# Patient Record
Sex: Female | Born: 1945 | Race: White | Hispanic: No | Marital: Married | Smoking: Never smoker
Health system: Southern US, Community
[De-identification: ages and names within clinical notes are randomized; demographics above are authoritative.]

## PROBLEM LIST (undated history)

## (undated) DIAGNOSIS — C801 Malignant (primary) neoplasm, unspecified: Secondary | ICD-10-CM

## (undated) DIAGNOSIS — C649 Malignant neoplasm of unspecified kidney, except renal pelvis: Secondary | ICD-10-CM

## (undated) DIAGNOSIS — E119 Type 2 diabetes mellitus without complications: Secondary | ICD-10-CM

## (undated) HISTORY — PX: PANCREAS SURGERY: SHX731

## (undated) HISTORY — PX: ENDOMETRIAL ABLATION: SHX621

## (undated) HISTORY — PX: ABDOMINAL HYSTERECTOMY: SHX81

## (undated) HISTORY — PX: NASAL SINUS SURGERY: SHX719

## (undated) HISTORY — PX: KIDNEY SURGERY: SHX687

## (undated) HISTORY — PX: CHOLECYSTECTOMY: SHX55

---

## 2010-04-13 ENCOUNTER — Emergency Department (HOSPITAL_BASED_OUTPATIENT_CLINIC_OR_DEPARTMENT_OTHER): Admission: EM | Admit: 2010-04-13 | Discharge: 2010-04-13 | Payer: Self-pay | Admitting: Emergency Medicine

## 2010-11-02 LAB — URINALYSIS, ROUTINE W REFLEX MICROSCOPIC
Glucose, UA: NEGATIVE mg/dL
Ketones, ur: NEGATIVE mg/dL
Nitrite: NEGATIVE
Specific Gravity, Urine: 1.011 (ref 1.005–1.030)
pH: 6 (ref 5.0–8.0)

## 2010-11-02 LAB — CBC
HCT: 44.1 % (ref 36.0–46.0)
MCV: 88.8 fL (ref 78.0–100.0)
Platelets: 183 10*3/uL (ref 150–400)
RBC: 4.97 MIL/uL (ref 3.87–5.11)
WBC: 12.4 10*3/uL — ABNORMAL HIGH (ref 4.0–10.5)

## 2010-11-02 LAB — BASIC METABOLIC PANEL
Chloride: 107 mEq/L (ref 96–112)
Creatinine, Ser: 1.2 mg/dL (ref 0.4–1.2)
GFR calc Af Amer: 55 mL/min — ABNORMAL LOW (ref >60)
GFR calc non Af Amer: 45 mL/min — ABNORMAL LOW (ref >60)
Potassium: 3.9 mEq/L (ref 3.5–5.1)

## 2010-11-02 LAB — DIFFERENTIAL
Basophils Absolute: 0.1 10*3/uL (ref 0.0–0.1)
Eosinophils Relative: 1 % (ref 0–5)
Lymphocytes Relative: 9 % — ABNORMAL LOW (ref 12–46)
Lymphs Abs: 1.2 10*3/uL (ref 0.7–4.0)
Monocytes Absolute: 1.1 10*3/uL — ABNORMAL HIGH (ref 0.1–1.0)
Neutro Abs: 9.9 10*3/uL — ABNORMAL HIGH (ref 1.7–7.7)

## 2010-11-02 LAB — URINE MICROSCOPIC-ADD ON

## 2016-11-22 ENCOUNTER — Encounter (HOSPITAL_BASED_OUTPATIENT_CLINIC_OR_DEPARTMENT_OTHER): Payer: Self-pay | Admitting: *Deleted

## 2016-11-22 ENCOUNTER — Emergency Department (HOSPITAL_BASED_OUTPATIENT_CLINIC_OR_DEPARTMENT_OTHER): Payer: Medicare Other

## 2016-11-22 ENCOUNTER — Emergency Department (HOSPITAL_BASED_OUTPATIENT_CLINIC_OR_DEPARTMENT_OTHER)
Admission: EM | Admit: 2016-11-22 | Discharge: 2016-11-22 | Disposition: A | Discharge status: Other Acute Inpatient Hospital | Payer: Medicare Other | Attending: Emergency Medicine | Admitting: Emergency Medicine

## 2016-11-22 DIAGNOSIS — N3000 Acute cystitis without hematuria: Secondary | ICD-10-CM | POA: Insufficient documentation

## 2016-11-22 DIAGNOSIS — J111 Influenza due to unidentified influenza virus with other respiratory manifestations: Secondary | ICD-10-CM

## 2016-11-22 DIAGNOSIS — R69 Illness, unspecified: Secondary | ICD-10-CM

## 2016-11-22 DIAGNOSIS — Z85528 Personal history of other malignant neoplasm of kidney: Secondary | ICD-10-CM | POA: Diagnosis not present

## 2016-11-22 DIAGNOSIS — R05 Cough: Secondary | ICD-10-CM | POA: Diagnosis present

## 2016-11-22 HISTORY — DX: Cystic fibrosis, unspecified: E84.9

## 2016-11-22 HISTORY — DX: Malignant neoplasm of unspecified kidney, except renal pelvis: C64.9

## 2016-11-22 HISTORY — DX: Malignant (primary) neoplasm, unspecified: C80.1

## 2016-11-22 LAB — CBC WITH DIFFERENTIAL/PLATELET
BASOS ABS: 0 10*3/uL (ref 0.0–0.1)
BASOS PCT: 0 %
EOS ABS: 0.1 10*3/uL (ref 0.0–0.7)
Eosinophils Relative: 2 %
HEMATOCRIT: 41.4 % (ref 36.0–46.0)
HEMOGLOBIN: 14.4 g/dL (ref 12.0–15.0)
Lymphocytes Relative: 16 %
Lymphs Abs: 1.3 10*3/uL (ref 0.7–4.0)
MCH: 32.1 pg (ref 26.0–34.0)
MCHC: 34.8 g/dL (ref 30.0–36.0)
MCV: 92.4 fL (ref 78.0–100.0)
MONOS PCT: 10 %
Monocytes Absolute: 0.8 10*3/uL (ref 0.1–1.0)
NEUTROS ABS: 5.8 10*3/uL (ref 1.7–7.7)
NEUTROS PCT: 72 %
Platelets: 192 10*3/uL (ref 150–400)
RBC: 4.48 MIL/uL (ref 3.87–5.11)
RDW: 18.7 % — ABNORMAL HIGH (ref 11.5–15.5)
WBC: 8 10*3/uL (ref 4.0–10.5)

## 2016-11-22 LAB — COMPREHENSIVE METABOLIC PANEL
ALK PHOS: 70 U/L (ref 38–126)
ALT: 40 U/L (ref 14–54)
ANION GAP: 9 (ref 5–15)
AST: 31 U/L (ref 15–41)
Albumin: 3.5 g/dL (ref 3.5–5.0)
BILIRUBIN TOTAL: 1.2 mg/dL (ref 0.3–1.2)
BUN: 16 mg/dL (ref 6–20)
CALCIUM: 8.9 mg/dL (ref 8.9–10.3)
CO2: 23 mmol/L (ref 22–32)
CREATININE: 1.09 mg/dL — AB (ref 0.44–1.00)
Chloride: 103 mmol/L (ref 101–111)
GFR, EST AFRICAN AMERICAN: 58 mL/min — AB (ref >60)
GFR, EST NON AFRICAN AMERICAN: 50 mL/min — AB (ref >60)
Glucose, Bld: 108 mg/dL — ABNORMAL HIGH (ref 65–99)
Potassium: 3.9 mmol/L (ref 3.5–5.1)
SODIUM: 135 mmol/L (ref 135–145)
TOTAL PROTEIN: 7.3 g/dL (ref 6.5–8.1)

## 2016-11-22 LAB — URINALYSIS, ROUTINE W REFLEX MICROSCOPIC
Glucose, UA: NEGATIVE mg/dL
HGB URINE DIPSTICK: NEGATIVE
Ketones, ur: 15 mg/dL — AB
NITRITE: NEGATIVE
PH: 6 (ref 5.0–8.0)
Protein, ur: >300 mg/dL — AB
SPECIFIC GRAVITY, URINE: 1.028 (ref 1.005–1.030)

## 2016-11-22 LAB — URINALYSIS, MICROSCOPIC (REFLEX)

## 2016-11-22 LAB — I-STAT CG4 LACTIC ACID, ED: Lactic Acid, Venous: 1.44 mmol/L (ref 0.5–1.9)

## 2016-11-22 LAB — INFLUENZA PANEL BY PCR (TYPE A & B)
INFLAPCR: NEGATIVE
INFLBPCR: NEGATIVE

## 2016-11-22 MED ORDER — SODIUM CHLORIDE 0.9 % IV BOLUS (SEPSIS)
1000.0000 mL | Freq: Once | INTRAVENOUS | Status: AC
  Administered 2016-11-22: 1000 mL via INTRAVENOUS

## 2016-11-22 MED ORDER — DEXTROSE 5 % IV SOLN
1.0000 g | Freq: Once | INTRAVENOUS | Status: AC
  Administered 2016-11-22: 1 g via INTRAVENOUS
  Filled 2016-11-22: qty 10

## 2016-11-22 MED ORDER — PROMETHAZINE HCL 25 MG/ML IJ SOLN
12.5000 mg | Freq: Once | INTRAMUSCULAR | Status: AC
  Administered 2016-11-22: 12.5 mg via INTRAVENOUS
  Filled 2016-11-22: qty 1

## 2016-11-22 MED ORDER — OSELTAMIVIR PHOSPHATE 75 MG PO CAPS
75.0000 mg | ORAL_CAPSULE | Freq: Once | ORAL | Status: AC
  Administered 2016-11-22: 75 mg via ORAL
  Filled 2016-11-22: qty 1

## 2016-11-22 MED ORDER — ACETAMINOPHEN 325 MG PO TABS
650.0000 mg | ORAL_TABLET | Freq: Once | ORAL | Status: AC
  Administered 2016-11-22: 650 mg via ORAL
  Filled 2016-11-22: qty 2

## 2016-11-22 MED ORDER — MORPHINE SULFATE (PF) 4 MG/ML IV SOLN
4.0000 mg | Freq: Once | INTRAVENOUS | Status: AC
  Administered 2016-11-22: 4 mg via INTRAVENOUS
  Filled 2016-11-22: qty 1

## 2016-11-22 NOTE — ED Provider Notes (Signed)
Emergency Department Provider Note   I have reviewed the triage vital signs and the nursing notes.   HISTORY  Chief Complaint Cough   HPI Taylor Stein is a 71 y.o. 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 He has baseline right-sided abdominal pain which is unchanged. She is also reporting sores in the mouth which she relates to her ongoing chemotherapy. She's had fever chills at home for the past several days. She presented to her primary care physician's office with the above symptoms and was referred to the emergency department for further testing. She denies any CP. No modifying factors. No dysuria, hesitancy, or urgency.   Past Medical History:  Diagnosis Date  . Cancer ([place])   . 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 Renal cell carcinoma ([place])     There are no active problems to display for this patient.   Past Surgical History:  Procedure Laterality Date  . ABDOMINAL HYSTERECTOMY    . CHOLECYSTECTOMY    . ENDOMETRIAL ABLATION    . KIDNEY SURGERY    . NASAL SINUS SURGERY      Current Outpatient Rx  . Order #: [id number] Class: Historical Med    Allergies Sulfa antibiotics; Codeine; Dilaudid [hydromorphone hcl]; Percocet [oxycodone-acetaminophen]; and Vicodin [hydrocodone-acetaminophen]  No family history on file.  Social History Social History  Substance Use Topics  . Smoking status: Never Smoker  . Smokeless tobacco: Never Used  . Alcohol use No    Review of Systems  Constitutional: Positive fever/chills. Positive fatigue.  Eyes: No visual changes. ENT: No sore throat. Positive nasal congestion.  Cardiovascular: Denies chest pain. Respiratory: Positive shortness of breath and cough.  Gastrointestinal: Positive RUQ abdominal pain. Positive severe nausea, no vomiting.  No diarrhea.  No constipation. Genitourinary: Negative for  dysuria. Musculoskeletal: Negative for back pain. Skin: Negative for rash. Neurological: Negative for focal weakness or numbness. Positive mild HA.   10-point ROS otherwise negative.  ____________________________________________   PHYSICAL EXAM:  VITAL SIGNS: ED Triage Vitals  Enc Vitals Group     BP 11/22/16 1719 129/81     Pulse Rate 11/22/16 1719 (!) 110     Resp 11/22/16 1719 20     Temp 11/22/16 1719 (!) 100.4 F (38 C)     Temp Source 11/22/16 1719 Oral     SpO2 11/22/16 1719 95 %     Weight 11/22/16 1719 164 lb (74.4 kg)     Height 11/22/16 1719 5\' 4"  (1.626 m)     Pain Score 11/22/16 1724 10   Constitutional: Alert and oriented. Appears generally weak and fatigued.  Eyes: Conjunctivae are normal. PERRL.  Head: Atraumatic. Nose: No congestion/rhinnorhea. Mouth/Throat: Mucous membranes are dry. Oropharynx non-erythematous. Neck: No stridor.  No meningeal signs.  Cardiovascular: Normal rate, regular rhythm. Good peripheral circulation. Grossly normal heart sounds.   Respiratory: Normal respiratory effort.  No retractions. Lungs CTAB. Gastrointestinal: Soft with focal RUQ tenderness to palpation. No rebound or guarding. No distention.  Musculoskeletal: No lower extremity tenderness nor edema. No gross deformities of extremities. Neurologic:  Normal speech and language. No gross focal neurologic deficits are appreciated.  Skin:  Skin is warm, dry and intact. No rash noted. Psychiatric: Mood and affect are normal. Speech and behavior are normal.  ____________________________________________   LABS (all labs ordered are listed, but only abnormal results are displayed)  Labs Reviewed  COMPREHENSIVE METABOLIC PANEL -  Abnormal; Notable for the following:       Result Value   Glucose, Bld 108 (*)    Creatinine, Ser 1.09 (*)    GFR calc non Af Amer 50 (*)    GFR calc Af Amer 58 (*)    All other components within normal limits  CBC WITH DIFFERENTIAL/PLATELET -  Abnormal; Notable for the following:    RDW 18.7 (*)    All other components within normal limits  URINALYSIS, ROUTINE W REFLEX MICROSCOPIC - Abnormal; Notable for the following:    Color, Urine AMBER (*)    APPearance CLOUDY (*)    Bilirubin Urine MODERATE (*)    Ketones, ur 15 (*)    Protein, ur >300 (*)    Leukocytes, UA MODERATE (*)    All other components within normal limits  URINALYSIS, MICROSCOPIC (REFLEX) - Abnormal; Notable for the following:    Bacteria, UA FEW (*)    Squamous Epithelial / LPF 6-30 (*)    All other components within normal limits  CULTURE, BLOOD (ROUTINE X 2)  CULTURE, BLOOD (ROUTINE X 2)  INFLUENZA PANEL BY PCR (TYPE A & B)  I-STAT CG4 LACTIC ACID, ED   ____________________________________________  EKG   EKG Interpretation  Date/Time:  Friday November 22 2016 18:59:30 EDT Ventricular Rate:  99 PR Interval:    QRS Duration: 87 QT Interval:  405 QTC Calculation: 520 R Axis:   57 Text Interpretation:  Sinus tachycardia Low voltage, precordial leads Nonspecific T abnormalities, lateral leads Prolonged QT interval No STEMI.  Confirmed by Shaneka Efaw MD, Ripley Lovecchio [id number]) on 11/22/2016 7:02:27 PM       ____________________________________________  RADIOLOGY  Dg Chest 2 View  Result Date: 11/22/2016 CLINICAL DATA:  Cough and fever for 1 week. History of renal cell carcinoma and cystic fibrosis. EXAM: CHEST  2 VIEW COMPARISON:  None. FINDINGS: Heart size and mediastinal contours are normal. Right chest wall Port-A-Cath appears appropriately positioned with tip at the level of the lower SVC. Lungs are at least mildly hyperexpanded. Lungs are clear. No pleural effusion or pneumothorax seen. Mild degenerative spurring throughout the slightly kyphotic thoracic spine. No acute or suspicious osseous finding. IMPRESSION: No active cardiopulmonary disease. No evidence of pneumonia or pulmonary edema. Electronically Signed   By: Franki Cabot M.D.   On: 11/22/2016 18:01     ____________________________________________   PROCEDURES  Procedure(s) performed:   Procedures  None ____________________________________________   INITIAL IMPRESSION / ASSESSMENT AND PLAN / ED COURSE  Pertinent labs & imaging results that were available during my care of the patient were reviewed by me and considered in my medical decision making (see chart for details).  Patient resents to the emergency department for evaluation of flulike symptoms along with fever in the setting of ongoing chemotherapy for metastatic renal cell carcinoma. The patient has a fever on arrival with associated tachycardia and mild hypoxemia. My suspicion for influenza is somewhat elevated. Also concern for possible underlying pneumonia or developing sepsis. Patient has a well appearing port in the right upper chest. She has tenderness in the right upper quadrant of her abdomen which she describes as baseline since her cancer returned.  Discussed patient's case with [facility], Dr. Dwyane Dee. Patient and family (if present) updated with plan. Care transferred to hospitalist service.  I reviewed all nursing notes, vitals, pertinent old records, EKGs, labs, imaging (as available).  ____________________________________________  FINAL CLINICAL IMPRESSION(S) / ED DIAGNOSES  Final diagnoses:  Influenza-like illness  Acute cystitis  without hematuria     MEDICATIONS GIVEN DURING THIS VISIT:  Medications  promethazine (PHENERGAN) injection 12.5 mg (12.5 mg Intravenous Given 11/22/16 1828)  sodium chloride 0.9 % bolus 1,000 mL (0 mLs Intravenous Stopped 11/22/16 1944)  acetaminophen (TYLENOL) tablet 650 mg (650 mg Oral Given 11/22/16 1832)  morphine 4 MG/ML injection 4 mg (4 mg Intravenous Given 11/22/16 1829)  sodium chloride 0.9 % bolus 1,000 mL (0 mLs Intravenous Stopped 11/22/16 2123)  morphine 4 MG/ML injection 4 mg (4 mg Intravenous Given 11/22/16 2047)  promethazine (PHENERGAN)  injection 12.5 mg (12.5 mg Intravenous Given 11/22/16 2048)  oseltamivir (TAMIFLU) capsule 75 mg (75 mg Oral Given 11/22/16 2123)  cefTRIAXone (ROCEPHIN) 1 g in dextrose 5 % 50 mL IVPB (0 g Intravenous Stopped 11/22/16 2237)     NEW OUTPATIENT MEDICATIONS STARTED DURING THIS VISIT:  None   Note:  This document was prepared using Dragon voice recognition software and may include unintentional dictation errors.  Nanda Quinton, MD Emergency Medicine   Margette Fast, MD 11/24/16 [phone]

## 2016-11-22 NOTE — ED Triage Notes (Signed)
Sent from her doctors office with fever, cough, ear pain, sore throat, body aches. Possible dehydration.

## 2016-11-27 LAB — CULTURE, BLOOD (ROUTINE X 2)
CULTURE: NO GROWTH
Culture: NO GROWTH
SPECIAL REQUESTS: ADEQUATE
SPECIAL REQUESTS: ADEQUATE

## 2017-11-28 ENCOUNTER — Emergency Department (HOSPITAL_BASED_OUTPATIENT_CLINIC_OR_DEPARTMENT_OTHER): Payer: Medicare Other

## 2017-11-28 ENCOUNTER — Emergency Department (HOSPITAL_BASED_OUTPATIENT_CLINIC_OR_DEPARTMENT_OTHER)
Admission: EM | Admit: 2017-11-28 | Discharge: 2017-11-28 | Disposition: A | Discharge status: Home / Self Care | Payer: Medicare Other | Attending: Emergency Medicine | Admitting: Emergency Medicine

## 2017-11-28 ENCOUNTER — Encounter (HOSPITAL_BASED_OUTPATIENT_CLINIC_OR_DEPARTMENT_OTHER): Payer: Self-pay

## 2017-11-28 ENCOUNTER — Other Ambulatory Visit: Payer: Self-pay

## 2017-11-28 DIAGNOSIS — J069 Acute upper respiratory infection, unspecified: Secondary | ICD-10-CM

## 2017-11-28 DIAGNOSIS — Z85528 Personal history of other malignant neoplasm of kidney: Secondary | ICD-10-CM | POA: Diagnosis not present

## 2017-11-28 DIAGNOSIS — R05 Cough: Secondary | ICD-10-CM | POA: Diagnosis present

## 2017-11-28 LAB — CBC WITH DIFFERENTIAL/PLATELET
BASOS ABS: 0 10*3/uL (ref 0.0–0.1)
Basophils Relative: 0 %
EOS ABS: 0.2 10*3/uL (ref 0.0–0.7)
Eosinophils Relative: 3 %
HCT: 40.8 % (ref 36.0–46.0)
HEMOGLOBIN: 12.8 g/dL (ref 12.0–15.0)
LYMPHS ABS: 1.4 10*3/uL (ref 0.7–4.0)
Lymphocytes Relative: 21 %
MCH: 28.4 pg (ref 26.0–34.0)
MCHC: 31.4 g/dL (ref 30.0–36.0)
MCV: 90.5 fL (ref 78.0–100.0)
Monocytes Absolute: 0.9 10*3/uL (ref 0.1–1.0)
Monocytes Relative: 14 %
NEUTROS PCT: 62 %
Neutro Abs: 4.1 10*3/uL (ref 1.7–7.7)
PLATELETS: 165 10*3/uL (ref 150–400)
RBC: 4.51 MIL/uL (ref 3.87–5.11)
RDW: 14.6 % (ref 11.5–15.5)
WBC: 6.6 10*3/uL (ref 4.0–10.5)

## 2017-11-28 LAB — COMPREHENSIVE METABOLIC PANEL
ALBUMIN: 3.9 g/dL (ref 3.5–5.0)
ALK PHOS: 71 U/L (ref 38–126)
ALT: 18 U/L (ref 14–54)
AST: 19 U/L (ref 15–41)
Anion gap: 9 (ref 5–15)
BUN: 14 mg/dL (ref 6–20)
CALCIUM: 9.3 mg/dL (ref 8.9–10.3)
CHLORIDE: 106 mmol/L (ref 101–111)
CO2: 22 mmol/L (ref 22–32)
CREATININE: 1.25 mg/dL — AB (ref 0.44–1.00)
GFR calc non Af Amer: 42 mL/min — ABNORMAL LOW (ref >60)
GFR, EST AFRICAN AMERICAN: 49 mL/min — AB (ref >60)
GLUCOSE: 113 mg/dL — AB (ref 65–99)
Potassium: 3.9 mmol/L (ref 3.5–5.1)
SODIUM: 137 mmol/L (ref 135–145)
Total Bilirubin: 0.6 mg/dL (ref 0.3–1.2)
Total Protein: 7.3 g/dL (ref 6.5–8.1)

## 2017-11-28 LAB — I-STAT CG4 LACTIC ACID, ED: Lactic Acid, Venous: 1.18 mmol/L (ref 0.5–1.9)

## 2017-11-28 MED ORDER — IPRATROPIUM-ALBUTEROL 0.5-2.5 (3) MG/3ML IN SOLN
3.0000 mL | Freq: Four times a day (QID) | RESPIRATORY_TRACT | Status: DC
  Administered 2017-11-28: 3 mL via RESPIRATORY_TRACT
  Filled 2017-11-28: qty 3

## 2017-11-28 MED ORDER — AZITHROMYCIN 250 MG PO TABS: ORAL_TABLET | ORAL | 0 refills | Status: DC

## 2017-11-28 NOTE — ED Notes (Signed)
Patient transported to X-ray 

## 2017-11-28 NOTE — Discharge Instructions (Signed)
Your flu [place] pending  and you can check your my chart, however you are out of the window for treatment at this time.  You appear to have an upper respiratory infection (URI). An upper respiratory tract infection, or cold, is a viral infection of the air passages leading to the lungs. It is contagious and can be spread to others, especially during the first 3 or 4 days.  RETURN IMMEDIATELY IF you develop shortness of breath, confusion or altered mental status, a new rash, become dizzy, faint, or poorly responsive, or are unable to be cared for at home.

## 2017-11-28 NOTE — ED Triage Notes (Signed)
C.o flu like sx day 3-NAD-to triage in w/c

## 2017-11-28 NOTE — ED Provider Notes (Addendum)
[facility]ER[facility]ENCY DEPARTMENT Provider Note   CSN: [id number] Arrival date & time: 11/28/17  1519     History   Chief Complaint Chief Complaint  Patient presents with  . Cough    HPI Taylor Stein is a 72 y.o. female who presents to the ED with CC of Cough. Onset 3 days ago. She has had associated fever of 101.1, relieved by antipyretics. She has associated painful and productive cough, Myalgia, rhinorrhea, denies sore throat. She had a recent surgery on her pancreas for pancreatic cancer and states taht she is unsure if the coughing pain is because her surgical site is still sore. SHe has a portacath in place and recently completed chemotherapy. 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  Her husband has had a recent URI. Patient took tylenol at 2:00 PM today.   HPI  Past Medical History:  Diagnosis Date  . Cancer ([place])   . 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 Renal cell carcinoma ([place])   . Renal cell carcinoma ([place])     There are no active problems to display for this patient.   Past Surgical History:  Procedure Laterality Date  . ABDOMINAL HYSTERECTOMY    . CHOLECYSTECTOMY    . ENDOMETRIAL ABLATION    . KIDNEY SURGERY    . NASAL SINUS SURGERY    . PANCREAS SURGERY       OB History   None      Home Medications    Prior to Admission medications   Medication Sig Start Date End Date Taking? Authorizing Provider  ALBUTEROL IN Inhale into the lungs.    [provider]    Family History No family history on file.  Social History Social History   Tobacco Use  . Smoking status: Never Smoker  . Smokeless tobacco: Never Used  Substance Use Topics  . Alcohol use: No  . Drug use: Never     Allergies   Sulfa antibiotics; Codeine; Dilaudid [hydromorphone hcl]; Gentamicin; Percocet [oxycodone-acetaminophen]; Vancomycin; and Vicodin [hydrocodone-acetaminophen]   Review of Systems Review of Systems Ten systems reviewed  and are negative for acute change, except as noted in the HPI.    Physical Exam Updated Vital Signs BP 127/81 (BP Location: Left Arm)   Pulse 76   Temp 98.8 F (37.1 C) (Oral)   Resp 20   Ht 5\' 4"  (1.626 m)   Wt 78.9 kg (174 lb)   SpO2 98%   BMI 29.87 kg/m   Physical Exam  Constitutional: She is oriented to person, place, and time. She appears well-developed and well-nourished. No distress.  Appears ill  HENT:  Head: Normocephalic and atraumatic.  Eyes: Pupils are equal, round, and reactive to light. Conjunctivae and EOM are normal. No scleral icterus.  Neck: Normal range of motion.  Cardiovascular: Normal rate, regular rhythm and normal heart sounds. Exam reveals no gallop and no friction rub.  No murmur heard. Pulmonary/Chest: Effort normal and breath sounds normal. No respiratory distress. She has no wheezes.  Abdominal: Soft. Bowel sounds are normal. She exhibits no distension and no mass. There is no tenderness. There is no guarding.  Neurological: She is alert and oriented to person, place, and time.  Skin: Skin is warm and dry. She is not diaphoretic.  Psychiatric: Her behavior is normal.  Nursing note and vitals reviewed.    ED Treatments / Results  Labs (all labs ordered are listed, but only abnormal results are displayed) Labs  Reviewed  CBC WITH DIFFERENTIAL/PLATELET  COMPREHENSIVE METABOLIC PANEL  INFLUENZA PANEL BY PCR (TYPE A & B)  I-STAT CG4 LACTIC ACID, ED    EKG None  Radiology Dg Chest 2 View  Result Date: 11/28/2017 CLINICAL DATA:  Fever, cough, congestion, nausea, and body aches for 2 days. History of cystic fibrosis and renal cell carcinoma. EXAM: CHEST - 2 VIEW COMPARISON:  11/22/2016 FINDINGS: Right jugular Port-A-Cath terminates over the lower SVC, unchanged. The cardiomediastinal silhouette is within normal limits. No airspace consolidation, edema, pleural effusion, or pneumothorax is identified. No acute osseous abnormality is seen.  Retroperitoneal surgical clips are partially visualized in the upper abdomen. IMPRESSION: No active cardiopulmonary disease. Electronically Signed   By: Logan Bores M.D.   On: 11/28/2017 15:59    Procedures Procedures (including critical care time)  Medications Ordered in ED Medications - No data to display   Initial Impression / Assessment and Plan / ED Course  I have reviewed the triage vital signs and the nursing notes.  Pertinent labs & imaging results that were available during my care of the patient were reviewed by me and considered in my medical decision making (see chart for details).     paitent with uri sxs and recent fevers. Her influenza is still pending. Patient has a complicated pmh including CF and gets recurrent CAP. Given this I will treat with azithromycin. Patient is out of the window for Flu treatment. She may follow closely with her pcp. DIscussed return precautions.   Final Clinical Impressions(s) / ED Diagnoses   Final diagnoses:  Upper respiratory tract infection, unspecified type    ED Discharge Orders    None       Margarita Mail, PA-C 11/29/17 0000    Blanchie Dessert, MD 11/29/17 0011    Margarita Mail, PA-C 12/13/17 4403    Blanchie Dessert, MD 12/13/17 2052

## 2017-11-29 LAB — INFLUENZA PANEL BY PCR (TYPE A & B)
Influenza A By PCR: NEGATIVE
Influenza B By PCR: NEGATIVE

## 2019-04-23 ENCOUNTER — Emergency Department (HOSPITAL_BASED_OUTPATIENT_CLINIC_OR_DEPARTMENT_OTHER)
Admission: EM | Admit: 2019-04-23 | Discharge: 2019-04-23 | Disposition: A | Discharge status: Home / Self Care | Payer: Medicare Other | Attending: Emergency Medicine | Admitting: Emergency Medicine

## 2019-04-23 ENCOUNTER — Emergency Department (HOSPITAL_BASED_OUTPATIENT_CLINIC_OR_DEPARTMENT_OTHER): Payer: Medicare Other

## 2019-04-23 ENCOUNTER — Encounter (HOSPITAL_BASED_OUTPATIENT_CLINIC_OR_DEPARTMENT_OTHER): Payer: Self-pay | Admitting: *Deleted

## 2019-04-23 ENCOUNTER — Other Ambulatory Visit: Payer: Self-pay

## 2019-04-23 DIAGNOSIS — R059 Cough, unspecified: Secondary | ICD-10-CM

## 2019-04-23 DIAGNOSIS — Z85528 Personal history of other malignant neoplasm of kidney: Secondary | ICD-10-CM | POA: Diagnosis not present

## 2019-04-23 DIAGNOSIS — R05 Cough: Secondary | ICD-10-CM | POA: Diagnosis present

## 2019-04-23 DIAGNOSIS — Z79899 Other long term (current) drug therapy: Secondary | ICD-10-CM | POA: Insufficient documentation

## 2019-04-23 DIAGNOSIS — M546 Pain in thoracic spine: Secondary | ICD-10-CM

## 2019-04-23 LAB — CBC
HCT: 39.3 % (ref 36.0–46.0)
Hemoglobin: 12.5 g/dL (ref 12.0–15.0)
MCH: 29.3 pg (ref 26.0–34.0)
MCHC: 31.8 g/dL (ref 30.0–36.0)
MCV: 92.3 fL (ref 80.0–100.0)
Platelets: 205 10*3/uL (ref 150–400)
RBC: 4.26 MIL/uL (ref 3.87–5.11)
RDW: 14.6 % (ref 11.5–15.5)
WBC: 7.8 10*3/uL (ref 4.0–10.5)
nRBC: 0 % (ref 0.0–0.2)

## 2019-04-23 LAB — BASIC METABOLIC PANEL
Anion gap: 12 (ref 5–15)
BUN: 20 mg/dL (ref 8–23)
CO2: 23 mmol/L (ref 22–32)
Calcium: 9.8 mg/dL (ref 8.9–10.3)
Chloride: 104 mmol/L (ref 98–111)
Creatinine, Ser: 1.04 mg/dL — ABNORMAL HIGH (ref 0.44–1.00)
GFR calc Af Amer: >60 mL/min (ref >60)
GFR calc non Af Amer: 54 mL/min — ABNORMAL LOW (ref >60)
Glucose, Bld: 131 mg/dL — ABNORMAL HIGH (ref 70–99)
Potassium: 4.1 mmol/L (ref 3.5–5.1)
Sodium: 139 mmol/L (ref 135–145)

## 2019-04-23 MED ORDER — SODIUM CHLORIDE 0.9 % IV SOLN
INTRAVENOUS | Status: DC
Start: 2019-04-23 — End: 2019-04-24
  Administered 2019-04-23: 21:00:00 via INTRAVENOUS

## 2019-04-23 MED ORDER — HEPARIN SOD (PORK) LOCK FLUSH 100 UNIT/ML IV SOLN
500.0000 [IU] | Freq: Once | INTRAVENOUS | Status: AC
  Administered 2019-04-23: 23:00:00 500 [IU]
  Filled 2019-04-23: qty 5

## 2019-04-23 MED ORDER — IOHEXOL 350 MG/ML SOLN
75.0000 mL | Freq: Once | INTRAVENOUS | Status: AC | PRN
  Administered 2019-04-23: 21:00:00 75 mL via INTRAVENOUS

## 2019-04-23 MED ORDER — MORPHINE SULFATE (PF) 2 MG/ML IV SOLN
2.0000 mg | Freq: Once | INTRAVENOUS | Status: AC
  Administered 2019-04-23: 21:00:00 2 mg via INTRAVENOUS
  Filled 2019-04-23: qty 1

## 2019-04-23 MED ORDER — TRAMADOL HCL 50 MG PO TABS: 50.0000 mg | ORAL_TABLET | Freq: Four times a day (QID) | ORAL | 0 refills | Status: AC | PRN

## 2019-04-23 MED ORDER — ONDANSETRON HCL 4 MG/2ML IJ SOLN
4.0000 mg | Freq: Once | INTRAMUSCULAR | Status: AC
  Administered 2019-04-23: 21:00:00 4 mg via INTRAVENOUS
  Filled 2019-04-23: qty 2

## 2019-04-23 NOTE — ED Notes (Signed)
Waiting on results from [facility] per radiology protocol

## 2019-04-23 NOTE — ED Triage Notes (Signed)
Back pain for 2 days and it gets worst today.  Coughing for awhile.

## 2019-04-23 NOTE — ED Provider Notes (Signed)
[place] EMERGENCY DEPARTMENT Provider Note   CSN: [id number] Arrival date & time: 04/23/19  1732     History   Chief Complaint Chief Complaint  Patient presents with   Back Pain   Cough    HPI Taylor Stein is a 73 y.o. female.     Patient presenting with a 2-day history of midthoracic back pain.  Patient's had a persistent cough for a while.  The pain is worse with coughing taking a deep breath and also with some movement no history of fall or injury.  Patient has a history of contained metastatic renal cell carcinoma.  And a history of cystic fibrosis.  Patient states that oftentimes when this is occurred in the past she has had pneumonia that does not show up on chest x-ray.  Patient denies any fevers.     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   Past Surgical History:  Procedure Laterality Date   ABDOMINAL HYSTERECTOMY     CHOLECYSTECTOMY     ENDOMETRIAL ABLATION     KIDNEY SURGERY     NASAL SINUS SURGERY     PANCREAS SURGERY       OB History    Gravida  2   Para  2   Term      Preterm      AB      Living        SAB      TAB      Ectopic      Multiple      Live Births               Home Medications    Prior to Admission medications   Medication Sig Start Date End Date Taking? Authorizing Provider  atorvastatin (LIPITOR) 40 MG tablet Take 40 mg by mouth daily.   Yes [provider]  metFORMIN (GLUCOPHAGE) 500 MG tablet Take by mouth 4 (four) times daily.   Yes [provider]  ALBUTEROL IN Inhale into the lungs.    [provider]    Family History Family History  Problem Relation Age of Onset   Heart failure Mother     Social History Social History   Tobacco Use   Smoking status: Never Smoker    Smokeless tobacco: Never Used  Substance Use Topics   Alcohol use: No   Drug use: Never     Allergies   Sulfa antibiotics, Codeine, Dilaudid [hydromorphone hcl], Gentamicin, Percocet [oxycodone-acetaminophen], Vancomycin, and Vicodin [hydrocodone-acetaminophen]   Review of Systems Review of Systems  Constitutional: Negative for chills and fever.  HENT: Negative for congestion, rhinorrhea and sore throat.   Eyes: Negative for visual disturbance.  Respiratory: Positive for cough. Negative for shortness of breath.   Cardiovascular: Negative for chest pain and leg swelling.  Gastrointestinal: Negative for abdominal pain, diarrhea, nausea and vomiting.  Genitourinary: Negative for dysuria.  Musculoskeletal: Positive for back pain. Negative for neck pain.  Skin: Negative for rash.  Neurological: Negative for dizziness, light-headedness and headaches.  Hematological: Does not bruise/bleed easily.  Psychiatric/Behavioral: Negative for confusion.     Physical Exam Updated Vital Signs BP 121/66    Pulse 84    Temp 98.3 F (36.8 C) (Oral)    Resp (!) 22  Ht 1.626 m (5\' 4" )    Wt 79.6 kg    SpO2 96%    BMI 30.14 kg/m   Physical Exam Vitals signs and nursing note reviewed.  Constitutional:      General: She is not in acute distress.    Appearance: Normal appearance. She is well-developed.  HENT:     Head: Normocephalic and atraumatic.  Eyes:     Extraocular Movements: Extraocular movements intact.     Conjunctiva/sclera: Conjunctivae normal.     Pupils: Pupils are equal, round, and reactive to light.  Neck:     Musculoskeletal: Neck supple.  Cardiovascular:     Rate and Rhythm: Normal rate and regular rhythm.     Heart sounds: No murmur.  Pulmonary:     Effort: Pulmonary effort is normal. No respiratory distress.     Breath sounds: Normal breath sounds.  Abdominal:     General: Bowel sounds are normal.     Palpations: Abdomen is soft.     Tenderness: There is no  abdominal tenderness.  Musculoskeletal: Normal range of motion.  Skin:    General: Skin is warm and dry.  Neurological:     General: No focal deficit present.     Mental Status: She is alert and oriented to person, place, and time.      ED Treatments / Results  Labs (all labs ordered are listed, but only abnormal results are displayed) Labs Reviewed  BASIC METABOLIC PANEL - Abnormal; Notable for the following components:      Result Value   Glucose, Bld 131 (*)    Creatinine, Ser 1.04 (*)    GFR calc non Af Amer 54 (*)    All other components within normal limits  CBC    EKG EKG Interpretation  Date/Time:  Friday April 23 2019 18:44:20 EDT Ventricular Rate:  90 PR Interval:    QRS Duration: 80 QT Interval:  358 QTC Calculation: 438 R Axis:   60 Text Interpretation:  Sinus rhythm Confirmed by Fredia Sorrow [phone]) on 04/23/2019 6:56:02 PM   Radiology Ct Angio Chest Pe W/cm &/or Wo Cm  Result Date: 04/23/2019 CLINICAL DATA:  Back pain for 2 days EXAM: CT ANGIOGRAPHY CHEST WITH CONTRAST TECHNIQUE: Multidetector CT imaging of the chest was performed using the standard protocol during bolus administration of intravenous contrast. Multiplanar CT image reconstructions and MIPs were obtained to evaluate the vascular anatomy. CONTRAST:  3mL OMNIPAQUE IOHEXOL 350 MG/ML SOLN COMPARISON:  None. FINDINGS: Cardiovascular: There is a optimal opacification of the pulmonary arteries. There is no central,segmental, or subsegmental filling defects within the pulmonary arteries. The heart is normal in size. No pericardial effusion thickening. No evidence right heart strain. There is normal three-vessel brachiocephalic anatomy without proximal stenosis. Coronary artery calcifications are seen. A right-sided central venous catheter seen with the tip at the superior cavoatrial junction. Mediastinum/Nodes: No hilar, mediastinal, or axillary adenopathy. Thyroid gland, trachea, and esophagus  demonstrate no significant findings. Lungs/Pleura: The lungs are clear. No pleural effusion or pneumothorax. No airspace consolidation. Upper Abdomen: Surgical clips from a prior right adrenal nephrectomy and cholecystectomy are seen. No acute abnormalities present in the visualized portions of the upper abdomen. Musculoskeletal: No chest wall abnormality. No acute or significant osseous findings. Degenerative changes seen in the thoracic spine. Review of the MIP images confirms the above findings. IMPRESSION: No central, segmental, or subsegmental pulmonary embolism. No acute intrathoracic pathology. Electronically Signed   By: Prudencio Pair M.D.   On: 04/23/2019 21:56  Dg Chest Port 1 View  Result Date: 04/23/2019 CLINICAL DATA:  Cough EXAM: PORTABLE CHEST 1 VIEW COMPARISON:  11/28/2017 FINDINGS: Right-sided central venous port tip over the SVC. No focal consolidation or effusion. Normal heart size. No pneumothorax. IMPRESSION: No active disease. Electronically Signed   By: Donavan Foil M.D.   On: 04/23/2019 19:32    Procedures Procedures (including critical care time)  Medications Ordered in ED Medications  0.9 %  sodium chloride infusion ( Intravenous New Bag/Given 04/23/19 2055)  ondansetron (ZOFRAN) injection 4 mg (4 mg Intravenous Given 04/23/19 2049)  morphine 2 MG/ML injection 2 mg (2 mg Intravenous Given 04/23/19 2049)  iohexol (OMNIPAQUE) 350 MG/ML injection 75 mL (75 mLs Intravenous Contrast Given 04/23/19 2123)     Initial Impression / Assessment and Plan / ED Course  I have reviewed the triage vital signs and the nursing notes.  Pertinent labs & imaging results that were available during my care of the patient were reviewed by me and considered in my medical decision making (see chart for details).       Chest x-ray without evidence of any pneumonia.  But based on patient's history which says she usually has pneumonia with the symptoms opted to do CT chest.  Since she is cancer  patient is opted to do CT angios to rule out pulmonary embolus as well.  CT angios head no acute findings no bony problems no lung abnormalities no evidence of any airspace disease.  And certainly no evidence of any pulmonary embolus.  Patient symptoms somewhat suggestive of musculoskeletal pain.  Patient has tramadol at home to take.  She will take this and follow-up with her doctors.  She will return for any new or worse symptoms.  Clinically not concerned about COVID-19 infection the only respiratory symptom she really has is the cough but she does have a history of cystic fibrosis and she has a cough frequently.   Final Clinical Impressions(s) / ED Diagnoses   Final diagnoses:  Cough  Acute bilateral thoracic back pain    ED Discharge Orders    None       Fredia Sorrow, MD 04/23/19 2236

## 2019-04-23 NOTE — Discharge Instructions (Addendum)
Work-up for the back pain including CT angios chest without evidence of blood clot no bony abnormalities no evidence of any pneumonia.  Pain does seem to be musculoskeletal in nature.  Take the tramadol that you have at home for that make an appointment to follow-up with your primary care doctor.  Return for any new or worse symptoms.

## 2019-10-04 ENCOUNTER — Other Ambulatory Visit: Payer: Self-pay

## 2019-10-04 ENCOUNTER — Ambulatory Visit: Payer: Medicare Other | Attending: Internal Medicine

## 2019-10-04 DIAGNOSIS — Z23 Encounter for immunization: Secondary | ICD-10-CM | POA: Insufficient documentation

## 2019-10-04 NOTE — Progress Notes (Signed)
   Covid-19 Vaccination Clinic  Name:  Taylor Stein    MRN: [id number] DOB: [birth year]  10/04/2019  Taylor Stein was observed post Covid-19 immunization for 15 minutes without incidence. She was provided with Vaccine Information Sheet and instruction to access the V-Safe system.   Taylor Stein was instructed to call 911 with any severe reactions post vaccine: Marland Kitchen Difficulty breathing  . Swelling of your face and throat  . A fast heartbeat  . A bad rash all over your body  . Dizziness and weakness    Immunizations Administered    Name Date Dose VIS Date Route   Moderna COVID-19 Vaccine 10/04/2019 11:51 AM 0.5 mL 07/20/2019 Intramuscular   Manufacturer: Moderna   Lot: [id number]   [id number]

## 2019-11-02 ENCOUNTER — Ambulatory Visit: Payer: Medicare Other | Attending: Internal Medicine

## 2019-11-02 DIAGNOSIS — Z23 Encounter for immunization: Secondary | ICD-10-CM

## 2019-11-02 NOTE — Progress Notes (Signed)
   Covid-19 Vaccination Clinic  Name:  Taylor Stein    MRN: [id number] DOB: 1945-10-26  11/02/2019  Ms. Taylor Stein was observed post Covid-19 immunization for 15 minutes without incident. She was provided with Vaccine Information Sheet and instruction to access the V-Safe system.   Ms. Taylor Stein was instructed to call 911 with any severe reactions post vaccine: Marland Kitchen Difficulty breathing  . Swelling of face and throat  . A fast heartbeat  . A bad rash all over body  . Dizziness and weakness   Immunizations Administered    Name Date Dose VIS Date Route   Moderna COVID-19 Vaccine 11/02/2019 11:39 AM 0.5 mL 07/20/2019 Intramuscular   Manufacturer: Moderna   Lot: [id number]   [id number]

## 2022-01-18 ENCOUNTER — Encounter (HOSPITAL_BASED_OUTPATIENT_CLINIC_OR_DEPARTMENT_OTHER): Payer: Self-pay | Admitting: Pediatrics

## 2022-01-18 ENCOUNTER — Emergency Department (HOSPITAL_BASED_OUTPATIENT_CLINIC_OR_DEPARTMENT_OTHER): Payer: Medicare Other

## 2022-01-18 ENCOUNTER — Other Ambulatory Visit: Payer: Self-pay

## 2022-01-18 ENCOUNTER — Emergency Department (HOSPITAL_BASED_OUTPATIENT_CLINIC_OR_DEPARTMENT_OTHER)
Admission: EM | Admit: 2022-01-18 | Discharge: 2022-01-18 | Disposition: A | Discharge status: Home / Self Care | Payer: Medicare Other | Attending: Emergency Medicine | Admitting: Emergency Medicine

## 2022-01-18 DIAGNOSIS — R1031 Right lower quadrant pain: Secondary | ICD-10-CM | POA: Diagnosis present

## 2022-01-18 DIAGNOSIS — H5509 Other forms of nystagmus: Secondary | ICD-10-CM | POA: Insufficient documentation

## 2022-01-18 DIAGNOSIS — R519 Headache, unspecified: Secondary | ICD-10-CM | POA: Insufficient documentation

## 2022-01-18 DIAGNOSIS — K859 Acute pancreatitis without necrosis or infection, unspecified: Secondary | ICD-10-CM | POA: Insufficient documentation

## 2022-01-18 DIAGNOSIS — Z8507 Personal history of malignant neoplasm of pancreas: Secondary | ICD-10-CM | POA: Diagnosis not present

## 2022-01-18 LAB — URINALYSIS, ROUTINE W REFLEX MICROSCOPIC
Bilirubin Urine: NEGATIVE
Glucose, UA: >=500 mg/dL — AB
Hgb urine dipstick: NEGATIVE
Ketones, ur: NEGATIVE mg/dL
Leukocytes,Ua: NEGATIVE
Nitrite: NEGATIVE
Protein, ur: NEGATIVE mg/dL
Specific Gravity, Urine: 1.02 (ref 1.005–1.030)
pH: 5.5 (ref 5.0–8.0)

## 2022-01-18 LAB — URINALYSIS, MICROSCOPIC (REFLEX): RBC / HPF: NONE SEEN RBC/hpf (ref 0–5)

## 2022-01-18 LAB — CBG MONITORING, ED: Glucose-Capillary: 296 mg/dL — ABNORMAL HIGH (ref 70–99)

## 2022-01-18 MED ORDER — ONDANSETRON HCL 4 MG/2ML IJ SOLN
4.0000 mg | Freq: Once | INTRAMUSCULAR | Status: AC
Start: 2022-01-18 — End: 2022-01-18
  Administered 2022-01-18: 4 mg via INTRAVENOUS
  Filled 2022-01-18: qty 2

## 2022-01-18 MED ORDER — HEPARIN SOD (PORK) LOCK FLUSH 100 UNIT/ML IV SOLN
500.0000 [IU] | Freq: Once | INTRAVENOUS | Status: AC
  Administered 2022-01-18: 500 [IU]
  Filled 2022-01-18: qty 5

## 2022-01-18 MED ORDER — SODIUM CHLORIDE 0.9 % IV BOLUS
500.0000 mL | Freq: Once | INTRAVENOUS | Status: AC
  Administered 2022-01-18: 500 mL via INTRAVENOUS

## 2022-01-18 MED ORDER — ONDANSETRON HCL 4 MG/2ML IJ SOLN
4.0000 mg | Freq: Once | INTRAMUSCULAR | Status: AC
  Administered 2022-01-18: 4 mg via INTRAVENOUS
  Filled 2022-01-18: qty 2

## 2022-01-18 MED ORDER — PROMETHAZINE HCL 25 MG/ML IJ SOLN
INTRAMUSCULAR | Status: AC
  Filled 2022-01-18: qty 1

## 2022-01-18 MED ORDER — SODIUM CHLORIDE 0.9 % IV SOLN: INTRAVENOUS | Status: DC | PRN

## 2022-01-18 MED ORDER — FENTANYL CITRATE PF 50 MCG/ML IJ SOSY
50.0000 ug | PREFILLED_SYRINGE | Freq: Once | INTRAMUSCULAR | Status: AC
  Administered 2022-01-18: 50 ug via INTRAVENOUS
  Filled 2022-01-18: qty 1

## 2022-01-18 MED ORDER — IOHEXOL 300 MG/ML  SOLN
100.0000 mL | Freq: Once | INTRAMUSCULAR | Status: AC | PRN
  Administered 2022-01-18: 100 mL via INTRAVENOUS

## 2022-01-18 MED ORDER — SODIUM CHLORIDE 0.9 % IV SOLN
12.5000 mg | Freq: Once | INTRAVENOUS | Status: AC
  Administered 2022-01-18: 12.5 mg via INTRAVENOUS
  Filled 2022-01-18: qty 0.5

## 2022-01-18 NOTE — ED Triage Notes (Signed)
Reported was sent by PCP due to head and stomach pain as well as elevated lipase. Patient endorsed hx tumor around the pancreas.

## 2022-01-18 NOTE — ED Notes (Signed)
Ginger ale given per po challenge

## 2022-01-18 NOTE — ED Provider Notes (Signed)
[place] EMERGENCY DEPARTMENT Provider Note   CSN: [id number] Arrival date & time: 01/18/22  1516     History  Chief Complaint  Patient presents with   Abdominal Pain   Headache    Abbi Mancini is a 76 y.o. female.  Patient is a 76 year old who presents with abdominal pain and nausea as well as headache and dizziness.  She has a history of prior pancreatic cancer.  On chart review, she is currently getting her treatment at [facility].  She has a remote history of renal cell carcinoma about 20 years ago.  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  It was not amenable to resection and she received electro pheresis treatment in 2018.  She also underwent chemotherapy and radiation per her report.  She had some slight enlargement of the mass about a year ago and underwent radiation treatment.  She most recently had a CT scan on April 19 of this year which found decrease in the lesion with no new lesions.  She says for about the last months she has had some pain across her upper abdomen associated with nausea and decreased appetite.  She has had some decrease in bowel movements over the last week.  No fevers.  No vomiting.  No cough or cold symptoms.  She has had a headache for about the same amount of time.  She says it is mostly left-sided.  She says it comes and goes.  She also has some intermittent dizziness.  She did say that the dizziness is worse when she goes from sitting to standing.  It does not seem to be related to head position other than going from sitting and standing.  However she does have associated spinning sensation at times feels off balance.  She does not have sustained symptoms.  No numbness or weakness to her extremities.  No speech deficits.  No vision changes.  She went to her PCP today and had blood work done.  Her blood work was noted to have an elevated lipase  and she was sent here for further imaging studies.  Also of note, she was given a prescription for treatment for cystitis.  She currently denies any symptoms such as urinary frequency or pain on urination.      Home Medications Prior to Admission medications   Medication Sig Start Date End Date Taking? Authorizing Provider  ALBUTEROL IN Inhale into the lungs.    [provider]  atorvastatin (LIPITOR) 40 MG tablet Take 40 mg by mouth daily.    [provider]  metFORMIN (GLUCOPHAGE) 500 MG tablet Take by mouth 4 (four) times daily.    [provider]  traMADol (ULTRAM) 50 MG tablet Take 1 tablet (50 mg total) by mouth every 6 (six) hours as needed. 04/23/19   Fredia Sorrow, MD      Allergies    Sulfa antibiotics, Codeine, Dilaudid [hydromorphone hcl], Gentamicin, Percocet [oxycodone-acetaminophen], Vancomycin, and Vicodin [hydrocodone-acetaminophen]    Review of Systems   Review of Systems  Constitutional:  Positive for appetite change and fatigue. Negative for chills, diaphoresis and fever.  HENT:  Negative for congestion, rhinorrhea and sneezing.   Eyes: Negative.   Respiratory:  Negative for cough, chest tightness and shortness of breath.   Cardiovascular:  Negative for chest pain and leg swelling.  Gastrointestinal:  Positive for abdominal pain and nausea. Negative for  blood in stool, diarrhea and vomiting.  Genitourinary:  Negative for difficulty urinating, flank pain, frequency and hematuria.  Musculoskeletal:  Negative for arthralgias and back pain.  Skin:  Negative for rash.  Neurological:  Positive for dizziness and headaches. Negative for speech difficulty, weakness and numbness.   Physical Exam Updated Vital Signs BP (!) 148/73   Pulse 79   Temp 97.8 F (36.6 C) (Oral)   Resp (!) 26   Ht '5\' 4"'$  (1.626 m)   Wt 77.1 kg   SpO2 100%   BMI 29.18 kg/m  Physical Exam Constitutional:      Appearance: She is well-developed.  HENT:     Head:  Normocephalic and atraumatic.  Eyes:     Pupils: Pupils are equal, round, and reactive to light.     Comments: Positive horizontal nystagmus with fast component to the left, no vertical or rotational nystagmus  Cardiovascular:     Rate and Rhythm: Normal rate and regular rhythm.     Heart sounds: Normal heart sounds.  Pulmonary:     Effort: Pulmonary effort is normal. No respiratory distress.     Breath sounds: Normal breath sounds. No wheezing or rales.  Chest:     Chest wall: No tenderness.  Abdominal:     General: Bowel sounds are normal.     Palpations: Abdomen is soft.     Tenderness: There is abdominal tenderness in the right upper quadrant, epigastric area and periumbilical area. There is no guarding or rebound.  Musculoskeletal:        General: Normal range of motion.     Cervical back: Normal range of motion and neck supple.  Lymphadenopathy:     Cervical: No cervical adenopathy.  Skin:    General: Skin is warm and dry.     Findings: No rash.  Neurological:     Mental Status: She is alert and oriented to person, place, and time.     Comments: Motor 5/5 all extremities Sensation grossly intact to LT all extremities Finger to Nose intact, no pronator drift CN II-XII grossly intact      ED Results / Procedures / Treatments   Labs (all labs ordered are listed, but only abnormal results are displayed) Labs Reviewed  URINALYSIS, ROUTINE W REFLEX MICROSCOPIC - Abnormal; Notable for the following components:      Result Value   Glucose, UA >=500 (*)    All other components within normal limits  URINALYSIS, MICROSCOPIC (REFLEX) - Abnormal; Notable for the following components:   Bacteria, UA FEW (*)    All other components within normal limits  CBG MONITORING, ED - Abnormal; Notable for the following components:   Glucose-Capillary 296 (*)    All other components within normal limits    EKG EKG Interpretation  Date/Time:  Friday January 18 2022 17:35:26  EDT Ventricular Rate:  79 PR Interval:  170 QRS Duration: 78 QT Interval:  375 QTC Calculation: 430 R Axis:   55 Text Interpretation: Sinus rhythm Confirmed by Malvin Johns [phone]) on 01/18/2022 5:37:39 PM  Radiology CT Head Wo Contrast  Result Date: 01/18/2022 CLINICAL DATA:  Headache.  Pancreatic tumor. EXAM: CT HEAD WITHOUT CONTRAST TECHNIQUE: Contiguous axial images were obtained from the base of the skull through the vertex without intravenous contrast. RADIATION DOSE REDUCTION: This exam was performed according to the departmental dose-optimization program which includes automated exposure control, adjustment of the mA and/or kV according to patient size and/or use of iterative reconstruction technique. COMPARISON:  12/07/2006  from [facility] FINDINGS: Brain: Moderate low density in the periventricular white matter likely related to small vessel disease. No mass lesion, hemorrhage, hydrocephalus, acute infarct, intra-axial, or extra-axial fluid collection. Vascular: No hyperdense vessel or unexpected calcification. Skull: Normal Sinuses/Orbits: Normal imaged portions of the orbits and globes. Prior sinus surgery. Mucosal thickening right maxillary sinus, sphenoid sinus. Clear mastoid air cells. Other: None. IMPRESSION: 1. No acute intracranial abnormality. 2. Moderate small vessel ischemic change. 3. Sinus disease. Electronically Signed   By: Abigail Miyamoto M.D.   On: 01/18/2022 17:12   CT Abdomen Pelvis W Contrast  Result Date: 01/18/2022 CLINICAL DATA:  Acute abdominal pain.  History of pancreatic cancer. EXAM: CT ABDOMEN AND PELVIS WITH CONTRAST TECHNIQUE: Multidetector CT imaging of the abdomen and pelvis was performed using the standard protocol following bolus administration of intravenous contrast. RADIATION DOSE REDUCTION: This exam was performed according to the departmental dose-optimization program which includes automated exposure control, adjustment of the mA and/or kV  according to patient size and/or use of iterative reconstruction technique. CONTRAST:  127m OMNIPAQUE IOHEXOL 300 MG/ML  SOLN COMPARISON:  CT chest abdomen and pelvis 12/05/2021. FINDINGS: Lower chest: No acute abnormality. Hepatobiliary: No focal liver abnormality is seen. Status post cholecystectomy. No biliary dilatation. Pancreas: Rounded hyperdense mass in the head of the pancreas measuring 15 mm appears unchanged. Pancreas is otherwise within normal limits. No ductal dilatation or acute inflammation. Spleen: Normal in size without focal abnormality. Adrenals/Urinary Tract: Rounded hypodensity in the left kidney is favored as a cyst and unchanged. Otherwise, the left kidney and adrenal glands are within normal limits. Right kidney and adrenal gland are likely surgically absent. Bladder is within normal limits. Stomach/Bowel: Stomach is within normal limits. no evidence of bowel wall thickening, distention, or inflammatory changes. The appendix is not seen. Vascular/Lymphatic: Aortic atherosclerosis. No enlarged abdominal or pelvic lymph nodes. Reproductive: Status post hysterectomy. No adnexal masses. Other: There are multiple surgical clips surrounding the pancreatic head, unchanged. There is no ascites or free air. No focal abdominal wall hernia. Musculoskeletal: No acute or significant osseous findings. IMPRESSION: 1. Unchanged 15 mm hyperdense mass in the pancreatic head. 2. No acute localizing process in the abdomen or pelvis. Electronically Signed   By: ARonney AstersM.D.   On: 01/18/2022 17:15    Procedures Procedures    Medications Ordered in ED Medications  0.9 %  sodium chloride infusion ( Intravenous New Bag/Given 01/18/22 1858)  sodium chloride 0.9 % bolus 500 mL (0 mLs Intravenous Stopped 01/18/22 1730)  fentaNYL (SUBLIMAZE) injection 50 mcg (50 mcg Intravenous Given 01/18/22 1559)  ondansetron (ZOFRAN) injection 4 mg (4 mg Intravenous Given 01/18/22 1558)  iohexol (OMNIPAQUE) 300 MG/ML  solution 100 mL (100 mLs Intravenous Contrast Given 01/18/22 1651)  ondansetron (ZOFRAN) injection 4 mg (4 mg Intravenous Given 01/18/22 1729)  sodium chloride 0.9 % bolus 500 mL (0 mLs Intravenous Stopped 01/18/22 1836)  promethazine (PHENERGAN) 12.5 mg in sodium chloride 0.9 % 50 mL IVPB (0 mg Intravenous Stopped 01/18/22 1856)  promethazine (PHENERGAN) 25 MG/ML injection (  [facility]56[facility]    ED Course/ Medical Decision Making/ A&P                           Medical Decision Making Amount and/or Complexity of Data Reviewed Labs: ordered. Radiology: ordered.  Risk Prescription drug management.   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  She comes in with epigastric pain and nausea as well as a headache.  She had labs done in outside facility earlier today.  Given this, they were not repeated.  Her white count was normal.  Her hemoglobin is normal.  I reviewed these labs.  Her LFTs were normal.  Her lipase was mildly elevated at 113.  Her glucose was elevated at 304.  Her creatinine was normal.  She was given IV fluids and symptomatic treatment for her nausea and pain with antiemetics and a dose of fentanyl.  She only required 1 dose of fentanyl.  She had imaging studies to rule out any worsening metastatic disease or other underlying etiologies.  She had a CT scan of her head which shows no acute abnormalities.  CT scan of her abdomen and pelvis show no change in the size of her pancreatic lesion.  No other acute abnormalities.  There is no evidence of obstruction.  No evidence of colitis.  I suspect her symptoms are related to some mild pancreatitis.  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  She is feeling better after treatment in the ED.  She is able to drink fluids without any nausea or vomiting.  I discussed options with her which would include  admission for symptomatic treatment versus home care.  At this point she is opting to go home.  I feel that since she is improved and her imaging looks okay, this would be appropriate.  She was given prescriptions for Zofran and some pain medication by her PCP today.  She will pick these up from the pharmacy and use them.  I encouraged her to have close follow-up with her PCP.  Return precautions were given.  Her glucose improved with IV fluids in the ED.  She does not have any suggestions of DKA.  Final Clinical Impression(s) / ED Diagnoses Final diagnoses:  Acute pancreatitis without infection or necrosis, unspecified pancreatitis type  Acute nonintractable headache, unspecified headache type    Rx / DC Orders ED Discharge Orders     None         Malvin Johns, MD 01/18/22 2039

## 2022-10-30 ENCOUNTER — Other Ambulatory Visit: Payer: Self-pay

## 2022-10-30 ENCOUNTER — Emergency Department (HOSPITAL_BASED_OUTPATIENT_CLINIC_OR_DEPARTMENT_OTHER): Payer: Medicare Other

## 2022-10-30 ENCOUNTER — Emergency Department (HOSPITAL_BASED_OUTPATIENT_CLINIC_OR_DEPARTMENT_OTHER)
Admission: EM | Admit: 2022-10-30 | Discharge: 2022-10-31 | Disposition: A | Discharge status: Other Acute Inpatient Hospital | Payer: Medicare Other | Attending: Emergency Medicine | Admitting: Emergency Medicine

## 2022-10-30 ENCOUNTER — Encounter (HOSPITAL_BASED_OUTPATIENT_CLINIC_OR_DEPARTMENT_OTHER): Payer: Self-pay | Admitting: Emergency Medicine

## 2022-10-30 DIAGNOSIS — J189 Pneumonia, unspecified organism: Secondary | ICD-10-CM | POA: Insufficient documentation

## 2022-10-30 DIAGNOSIS — R059 Cough, unspecified: Secondary | ICD-10-CM | POA: Diagnosis present

## 2022-10-30 DIAGNOSIS — R000 Tachycardia, unspecified: Secondary | ICD-10-CM | POA: Insufficient documentation

## 2022-10-30 DIAGNOSIS — Z1152 Encounter for screening for COVID-19: Secondary | ICD-10-CM | POA: Insufficient documentation

## 2022-10-30 LAB — CBC WITH DIFFERENTIAL/PLATELET
Abs Immature Granulocytes: 0.05 10*3/uL (ref 0.00–0.07)
Basophils Absolute: 0 10*3/uL (ref 0.0–0.1)
Basophils Relative: 0 %
Eosinophils Absolute: 0.1 10*3/uL (ref 0.0–0.5)
Eosinophils Relative: 1 %
HCT: 37.7 % (ref 36.0–46.0)
Hemoglobin: 12.3 g/dL (ref 12.0–15.0)
Immature Granulocytes: 0 %
Lymphocytes Relative: 8 %
Lymphs Abs: 0.9 10*3/uL (ref 0.7–4.0)
MCH: 28.9 pg (ref 26.0–34.0)
MCHC: 32.6 g/dL (ref 30.0–36.0)
MCV: 88.5 fL (ref 80.0–100.0)
Monocytes Absolute: 0.8 10*3/uL (ref 0.1–1.0)
Monocytes Relative: 7 %
Neutro Abs: 9.8 10*3/uL — ABNORMAL HIGH (ref 1.7–7.7)
Neutrophils Relative %: 84 %
Platelets: 232 10*3/uL (ref 150–400)
RBC: 4.26 MIL/uL (ref 3.87–5.11)
RDW: 14.3 % (ref 11.5–15.5)
WBC: 11.8 10*3/uL — ABNORMAL HIGH (ref 4.0–10.5)
nRBC: 0 % (ref 0.0–0.2)

## 2022-10-30 LAB — COMPREHENSIVE METABOLIC PANEL
ALT: 16 U/L (ref 0–44)
AST: 14 U/L — ABNORMAL LOW (ref 15–41)
Albumin: 3.1 g/dL — ABNORMAL LOW (ref 3.5–5.0)
Alkaline Phosphatase: 75 U/L (ref 38–126)
Anion gap: 7 (ref 5–15)
BUN: 13 mg/dL (ref 8–23)
CO2: 22 mmol/L (ref 22–32)
Calcium: 8.6 mg/dL — ABNORMAL LOW (ref 8.9–10.3)
Chloride: 104 mmol/L (ref 98–111)
Creatinine, Ser: 1.03 mg/dL — ABNORMAL HIGH (ref 0.44–1.00)
GFR, Estimated: 56 mL/min — ABNORMAL LOW (ref >60)
Glucose, Bld: 200 mg/dL — ABNORMAL HIGH (ref 70–99)
Potassium: 3.5 mmol/L (ref 3.5–5.1)
Sodium: 133 mmol/L — ABNORMAL LOW (ref 135–145)
Total Bilirubin: 1.3 mg/dL — ABNORMAL HIGH (ref 0.3–1.2)
Total Protein: 7.2 g/dL (ref 6.5–8.1)

## 2022-10-30 LAB — URINALYSIS, W/ REFLEX TO CULTURE (INFECTION SUSPECTED)
Glucose, UA: NEGATIVE mg/dL
Hgb urine dipstick: NEGATIVE
Ketones, ur: 40 mg/dL — AB
Nitrite: POSITIVE — AB
Protein, ur: >=300 mg/dL — AB
Specific Gravity, Urine: >=1.03 (ref 1.005–1.030)
pH: 6 (ref 5.0–8.0)

## 2022-10-30 LAB — RESP PANEL BY RT-PCR (RSV, FLU A&B, COVID)  RVPGX2
Influenza A by PCR: NEGATIVE
Influenza B by PCR: NEGATIVE
Resp Syncytial Virus by PCR: NEGATIVE
SARS Coronavirus 2 by RT PCR: NEGATIVE

## 2022-10-30 LAB — LACTIC ACID, PLASMA: Lactic Acid, Venous: 1 mmol/L (ref 0.5–1.9)

## 2022-10-30 MED ORDER — SODIUM CHLORIDE 0.9 % IV BOLUS
500.0000 mL | Freq: Once | INTRAVENOUS | Status: AC
  Administered 2022-10-30: 500 mL via INTRAVENOUS

## 2022-10-30 MED ORDER — SODIUM CHLORIDE 0.9 % IV SOLN: 2.0000 g | Freq: Once | INTRAVENOUS | Status: DC

## 2022-10-30 MED ORDER — SODIUM CHLORIDE 0.9 % IV SOLN
2.0000 g | Freq: Once | INTRAVENOUS | Status: AC
  Administered 2022-10-30: 2 g via INTRAVENOUS
  Filled 2022-10-30: qty 12.5

## 2022-10-30 MED ORDER — SODIUM CHLORIDE 0.9 % IV SOLN
500.0000 mg | Freq: Once | INTRAVENOUS | Status: DC
  Filled 2022-10-30: qty 5

## 2022-10-30 MED ORDER — ACETAMINOPHEN 325 MG PO TABS
650.0000 mg | ORAL_TABLET | Freq: Once | ORAL | Status: AC
  Administered 2022-10-30: 650 mg via ORAL
  Filled 2022-10-30: qty 2

## 2022-10-30 NOTE — ED Notes (Signed)
ED Provider at bedside. 

## 2022-10-30 NOTE — ED Notes (Signed)
1 set blood cultures collected from [facility]

## 2022-10-30 NOTE — ED Provider Notes (Signed)
[place] EMERGENCY DEPARTMENT AT [facility] Provider Note   CSN: [id number] Arrival date & time: 10/30/22  1206     History  Chief Complaint  Patient presents with   Cough    Taylor Stein is a 77 y.o. female.  77 yo F with a chief complaint of not feeling well.  This been going on for about a week.  She says that her husband's had to help her sit up and get out of bed and has been pushing her out of the wheelchair.  It is not normal for her.  She tells me she is been having cough congestion fever and sore throat.  She does not feel like it is getting any better and so came here for evaluation.   Cough      Home Medications Prior to Admission medications   Medication Sig Start Date End Date Taking? Authorizing Provider  ALBUTEROL IN Inhale into the lungs.    [provider]  atorvastatin (LIPITOR) 40 MG tablet Take 40 mg by mouth daily.    [provider]  metFORMIN (GLUCOPHAGE) 500 MG tablet Take by mouth 4 (four) times daily.    [provider]  traMADol (ULTRAM) 50 MG tablet Take 1 tablet (50 mg total) by mouth every 6 (six) hours as needed. 04/23/19   Fredia Sorrow, MD      Allergies    Sulfa antibiotics, Codeine, Dilaudid [hydromorphone hcl], Gentamicin, Percocet [oxycodone-acetaminophen], Vancomycin, and Vicodin [hydrocodone-acetaminophen]    Review of Systems   Review of Systems  Respiratory:  Positive for cough.     Physical Exam Updated Vital Signs BP 118/82 (BP Location: Right Arm)   Pulse (!) 107   Temp 99.5 F (37.5 C) (Oral)   Resp 18   Ht '5\' 4"'$  (1.626 m)   SpO2 94%   BMI 29.18 kg/m  Physical Exam Vitals and nursing note reviewed.  Constitutional:      General: She is not in acute distress.    Appearance: She is well-developed. She is not diaphoretic.  HENT:     Head: Normocephalic and atraumatic.  Eyes:     Pupils: Pupils are equal, round, and reactive to light.  Cardiovascular:     Rate and  Rhythm: Normal rate and regular rhythm.     Heart sounds: No murmur heard.    No friction rub. No gallop.  Pulmonary:     Effort: Pulmonary effort is normal.     Breath sounds: No wheezing or rales.  Abdominal:     General: There is no distension.     Palpations: Abdomen is soft.     Tenderness: There is no abdominal tenderness.  Musculoskeletal:        General: No tenderness.     Cervical back: Normal range of motion and neck supple.     Comments: Patient was too weak to sit up on her own  Skin:    General: Skin is warm and dry.  Neurological:     Mental Status: She is alert and oriented to person, place, and time.  Psychiatric:        Behavior: Behavior normal.     ED Results / Procedures / Treatments   Labs (all labs ordered are listed, but only abnormal results are displayed) Labs Reviewed  RESP PANEL BY RT-PCR (RSV, FLU A&B, COVID)  [id number]  CULTURE, BLOOD (ROUTINE X 2)  CULTURE, BLOOD (ROUTINE X 2)  CBC WITH DIFFERENTIAL/PLATELET  COMPREHENSIVE METABOLIC PANEL  LACTIC ACID,  PLASMA  LACTIC ACID, PLASMA    EKG None  Radiology DG Chest 2 View  Result Date: 10/30/2022 CLINICAL DATA:  Cough, fever, and chills for the past week. EXAM: CHEST - 2 VIEW COMPARISON:  CT chest and chest x-ray dated April 23, 2019. FINDINGS: Unchanged right chest wall port catheter. The heart size and mediastinal contours are within normal limits. Normal pulmonary vascularity. Subtle increased reticulonodular opacities at the right lung base. No focal consolidation, pleural effusion, or pneumothorax. No acute osseous abnormality. IMPRESSION: 1. Subtle increased reticulonodular opacities at the right lung base, concerning for atypical pneumonia. Electronically Signed   By: Titus Dubin M.D.   On: 10/30/2022 12:48    Procedures Procedures    Medications Ordered in ED Medications  azithromycin (ZITHROMAX) 500 mg in sodium chloride 0.9 % 250 mL IVPB (has no administration in time range)   ceFEPIme (MAXIPIME) 2 g in sodium chloride 0.9 % 100 mL IVPB (has no administration in time range)    ED Course/ Medical Decision Making/ A&P                             Medical Decision Making Amount and/or Complexity of Data Reviewed Labs: ordered. Radiology: ordered.   77 yo F with a chief complaint of cough congestion fevers chills myalgias going on for the past week.  Profoundly weak now.  Has been eating and drinking but much less than normal.  Chest x-ray concerning for possible atypical pneumonia on my independent interpretation.  I discussed inpatient versus outpatient therapy and based on the patient's profound fatigue at home will start on IV antibiotics.  On my record review the patient has an unfortunate history of recurrent pneumonias, has had Pseudomonas in the past.  Will start on cefepime and azithromycin.  Awaiting blood work.  Will discuss with medicine.  Signed out to Dr. Tamera Punt, please see their note for further details of care in the ED.  The patients results and plan were reviewed and discussed.   Any x-rays performed were independently reviewed by myself.   Differential diagnosis were considered with the presenting HPI.  Medications  azithromycin (ZITHROMAX) 500 mg in sodium chloride 0.9 % 250 mL IVPB (has no administration in time range)  ceFEPIme (MAXIPIME) 2 g in sodium chloride 0.9 % 100 mL IVPB (has no administration in time range)    Vitals:   10/30/22 1217 10/30/22 1217  BP:  118/82  Pulse:  (!) 107  Resp:  18  Temp:  99.5 F (37.5 C)  TempSrc:  Oral  SpO2:  94%  Height: '5\' 4"'$  (1.626 m)     Final diagnoses:  Community acquired pneumonia of right lower lobe of lung           Final Clinical Impression(s) / ED Diagnoses Final diagnoses:  Community acquired pneumonia of right lower lobe of lung    Rx / DC Orders ED Discharge Orders     None         Deno Etienne, DO 10/30/22 1517

## 2022-10-30 NOTE — ED Notes (Signed)
Charge nurse was able to persuade pt to put cardiac monitoring back on. Also put pt on 2L [place] for comfort

## 2022-10-30 NOTE — Plan of Care (Signed)
Plan of Care Note for accepted transfer   Patient: Taylor Stein MRN: [id number]   [place]: 10/30/2022  Facility requesting transfer: [facility] Requesting Provider: Dr. Tamera Punt Reason for transfer: Generalized weakness/pneumonia Facility course:  Patient presented to [facility] with a 1 week history of generalized weakness, cough, congestion, fever, sore throat with no significant improvement. Per ED physician chest x-ray done concerning for possible atypical pneumonia and patient due to prior history of Pseudomonas pneumonias in the past, history of renal cell carcinoma,??  Cystic fibrosis recommended admission for empiric IV antibiotics and generalized weakness. Comprehensive metabolic profile obtained with a sodium of 133, glucose of 200, creatinine 1.03, albumin of 3.1 bilirubin of 1.3 otherwise within normal limits.  CBC with a white count of 11.8 advised was within normal limits.  SARS coronavirus 2 PCR negative, influenza a and B PCR negative, RSV PCR negative. Chest x-ray done with subtle increased reticulonodular opacities at the right lung base concerning for atypical pneumonia. -Blood cultures ordered and pending. -Patient placed empirically on IV cefepime and IV azithromycin.  Hospitalist called to admit the patient.   Plan of care: The patient is accepted for admission to Progressive unit, at [facility]    Author: Irine Seal, MD 10/30/2022  Check [email or web address] for on-call coverage.  Nursing staff, Please call [place] number on Amion as soon as patient's arrival, so appropriate admitting provider can evaluate the pt.

## 2022-10-30 NOTE — ED Notes (Signed)
Pt took off heart monitor and refuses to let staff put it back on

## 2022-10-30 NOTE — ED Triage Notes (Signed)
Pt c/o cough, fever, chills x 1 wk

## 2022-10-30 NOTE — ED Notes (Signed)
2nd set of blood cultures collected from [facility] prior to abx start

## 2022-10-30 NOTE — ED Provider Notes (Addendum)
Results for orders placed or performed during the hospital encounter of 10/30/22  Resp panel by RT-PCR (RSV, Flu A&B, Covid) Anterior Nasal Swab   Specimen: Anterior Nasal Swab  Result Value Ref Range   SARS Coronavirus 2 by RT PCR NEGATIVE NEGATIVE   Influenza A by PCR NEGATIVE NEGATIVE   Influenza B by PCR NEGATIVE NEGATIVE   Resp Syncytial Virus by PCR NEGATIVE NEGATIVE  CBC with Differential  Result Value Ref Range   WBC 11.8 (H) 4.0 - 10.5 K/uL   RBC 4.26 3.87 - 5.11 MIL/uL   Hemoglobin 12.3 12.0 - 15.0 g/dL   HCT 37.7 36.0 - 46.0 %   MCV 88.5 80.0 - 100.0 fL   MCH 28.9 26.0 - 34.0 pg   MCHC 32.6 30.0 - 36.0 g/dL   RDW 14.3 11.5 - 15.5 %   Platelets 232 150 - 400 K/uL   nRBC 0.0 0.0 - 0.2 %   Neutrophils Relative % 84 %   Neutro Abs 9.8 (H) 1.7 - 7.7 K/uL   Lymphocytes Relative 8 %   Lymphs Abs 0.9 0.7 - 4.0 K/uL   Monocytes Relative 7 %   Monocytes Absolute 0.8 0.1 - 1.0 K/uL   Eosinophils Relative 1 %   Eosinophils Absolute 0.1 0.0 - 0.5 K/uL   Basophils Relative 0 %   Basophils Absolute 0.0 0.0 - 0.1 K/uL   Immature Granulocytes 0 %   Abs Immature Granulocytes 0.05 0.00 - 0.07 K/uL  Comprehensive metabolic panel  Result Value Ref Range   Sodium 133 (L) 135 - 145 mmol/L   Potassium 3.5 3.5 - 5.1 mmol/L   Chloride 104 98 - 111 mmol/L   CO2 22 22 - 32 mmol/L   Glucose, Bld 200 (H) 70 - 99 mg/dL   BUN 13 8 - 23 mg/dL   Creatinine, Ser 1.03 (H) 0.44 - 1.00 mg/dL   Calcium 8.6 (L) 8.9 - 10.3 mg/dL   Total Protein 7.2 6.5 - 8.1 g/dL   Albumin 3.1 (L) 3.5 - 5.0 g/dL   AST 14 (L) 15 - 41 U/L   ALT 16 0 - 44 U/L   Alkaline Phosphatase 75 38 - 126 U/L   Total Bilirubin 1.3 (H) 0.3 - 1.2 mg/dL   GFR, Estimated 56 (L) >60 mL/min   Anion gap 7 5 - 15  Lactic acid, plasma  Result Value Ref Range   Lactic Acid, Venous 1.0 0.5 - 1.9 mmol/L   DG Chest 2 View  Result Date: 10/30/2022 CLINICAL DATA:  Cough, fever, and chills for the past week. EXAM: CHEST - 2 VIEW  COMPARISON:  CT chest and chest x-ray dated April 23, 2019. FINDINGS: Unchanged right chest wall port catheter. The heart size and mediastinal contours are within normal limits. Normal pulmonary vascularity. Subtle increased reticulonodular opacities at the right lung base. No focal consolidation, pleural effusion, or pneumothorax. No acute osseous abnormality. IMPRESSION: 1. Subtle increased reticulonodular opacities at the right lung base, concerning for atypical pneumonia. Electronically Signed   By: Titus Dubin M.D.   On: 10/30/2022 12:48    Patient's labs reviewed.  Her white count is mildly elevated.  Her COVID/flu are negative.  Chest x-ray shows evidence of pneumonia.  She has been given antibiotics by Dr. Tyrone Nine.  Her lactate is normal.  I spoke with Dr. Grandville Silos who will admit the patient for further treatment.  Patient now states that she does not want to be admitted to a [facility] facility.  She wants  to be admitted to [facility].  She is very adamant about not going to a [facility] facility.  She does have a ready bed at [facility] but is refusing this.  I did contact [facility] on the [employer].  They have accepted the patient for admission to [facility] but currently do not have any beds.  They state that it may be tomorrow morning before a bed is ready.  Patient is okay waiting here at med center until a bed is ready.  Will start her chronic meds.  Actually, [facility] thinks that they will have a bed tonight, accepted by Dr. Ricki Rodriguez.  Will hold off on ordering her home meds.  She states she has not taken her home medications in 2 weeks.   Malvin Johns, MD 10/30/22 1657    Malvin Johns, MD 10/30/22 Brown Human, MD 10/30/22 [id number]

## 2022-10-30 NOTE — ED Notes (Signed)
Pt had pulled off port dressing. Refused to lay on back for dressing replacement. Site cleaned with CHG, and new biopatch and dressing. Pt educated on importance on keeping port site covered.

## 2022-10-31 DIAGNOSIS — J189 Pneumonia, unspecified organism: Secondary | ICD-10-CM | POA: Diagnosis not present

## 2022-10-31 MED ORDER — ACETAMINOPHEN 325 MG PO TABS
650.0000 mg | ORAL_TABLET | Freq: Four times a day (QID) | ORAL | Status: DC | PRN
  Administered 2022-10-31: 650 mg via ORAL
  Filled 2022-10-31: qty 2

## 2022-10-31 MED ORDER — SODIUM CHLORIDE 0.9 % IV SOLN: INTRAVENOUS | Status: DC | PRN

## 2022-10-31 NOTE — ED Provider Notes (Signed)
Blood pressure (!) 144/68, pulse 93, temperature 98.9 F (37.2 C), temperature source Oral, resp. rate (!) 21, height '5\' 4"'$  (1.626 m), SpO2 93 %.  Assuming care from Dr. Tamera Punt.  In short, Taylor Stein is a 77 y.o. female with a chief complaint of Cough .  Refer to the original H&P for additional details.  The current plan of care is to hold her awaiting admit at [facility] per patient's request.  04:15 AM Called by [facility] to say they have a bed available. Patient stable for transfer. EMTALA documentation performed. Reason for transfer is patient's preference to remain in the [facility] system. Dr. Ricki Rodriguez accepting earlier with Dr. Tamera Punt.     Margette Fast, MD 10/31/22 [phone]

## 2022-11-01 LAB — URINE CULTURE: Culture: NO GROWTH

## 2022-11-04 LAB — CULTURE, BLOOD (ROUTINE X 2)
Culture: NO GROWTH
Culture: NO GROWTH
Special Requests: ADEQUATE

## 2023-01-05 ENCOUNTER — Other Ambulatory Visit: Payer: Self-pay

## 2023-01-05 ENCOUNTER — Encounter (HOSPITAL_BASED_OUTPATIENT_CLINIC_OR_DEPARTMENT_OTHER): Payer: Self-pay | Admitting: Emergency Medicine

## 2023-01-05 ENCOUNTER — Emergency Department (HOSPITAL_BASED_OUTPATIENT_CLINIC_OR_DEPARTMENT_OTHER)
Admission: EM | Admit: 2023-01-05 | Discharge: 2023-01-06 | Disposition: A | Discharge status: Home / Self Care | Payer: Medicare Other | Attending: Emergency Medicine | Admitting: Emergency Medicine

## 2023-01-05 ENCOUNTER — Emergency Department (HOSPITAL_BASED_OUTPATIENT_CLINIC_OR_DEPARTMENT_OTHER): Payer: Medicare Other

## 2023-01-05 DIAGNOSIS — R051 Acute cough: Secondary | ICD-10-CM | POA: Diagnosis present

## 2023-01-05 DIAGNOSIS — I2510 Atherosclerotic heart disease of native coronary artery without angina pectoris: Secondary | ICD-10-CM | POA: Diagnosis not present

## 2023-01-05 DIAGNOSIS — R0602 Shortness of breath: Secondary | ICD-10-CM | POA: Insufficient documentation

## 2023-01-05 DIAGNOSIS — B9781 Human metapneumovirus as the cause of diseases classified elsewhere: Secondary | ICD-10-CM | POA: Diagnosis not present

## 2023-01-05 DIAGNOSIS — Z7984 Long term (current) use of oral hypoglycemic drugs: Secondary | ICD-10-CM | POA: Insufficient documentation

## 2023-01-05 DIAGNOSIS — Z85528 Personal history of other malignant neoplasm of kidney: Secondary | ICD-10-CM | POA: Diagnosis not present

## 2023-01-05 DIAGNOSIS — Z1152 Encounter for screening for COVID-19: Secondary | ICD-10-CM | POA: Insufficient documentation

## 2023-01-05 DIAGNOSIS — B348 Other viral infections of unspecified site: Secondary | ICD-10-CM

## 2023-01-05 DIAGNOSIS — B999 Unspecified infectious disease: Secondary | ICD-10-CM | POA: Insufficient documentation

## 2023-01-05 LAB — BASIC METABOLIC PANEL
Anion gap: 14 (ref 5–15)
BUN: 13 mg/dL (ref 8–23)
CO2: 19 mmol/L — ABNORMAL LOW (ref 22–32)
Calcium: 9.1 mg/dL (ref 8.9–10.3)
Chloride: 102 mmol/L (ref 98–111)
Creatinine, Ser: 1.1 mg/dL — ABNORMAL HIGH (ref 0.44–1.00)
GFR, Estimated: 52 mL/min — ABNORMAL LOW (ref >60)
Glucose, Bld: 209 mg/dL — ABNORMAL HIGH (ref 70–99)
Potassium: 4.3 mmol/L (ref 3.5–5.1)
Sodium: 135 mmol/L (ref 135–145)

## 2023-01-05 LAB — CBC WITH DIFFERENTIAL/PLATELET
Abs Immature Granulocytes: 0.01 10*3/uL (ref 0.00–0.07)
Basophils Absolute: 0 10*3/uL (ref 0.0–0.1)
Basophils Relative: 0 %
Eosinophils Absolute: 0.2 10*3/uL (ref 0.0–0.5)
Eosinophils Relative: 3 %
HCT: 46.4 % — ABNORMAL HIGH (ref 36.0–46.0)
Hemoglobin: 15 g/dL (ref 12.0–15.0)
Immature Granulocytes: 0 %
Lymphocytes Relative: 20 %
Lymphs Abs: 1 10*3/uL (ref 0.7–4.0)
MCH: 29 pg (ref 26.0–34.0)
MCHC: 32.3 g/dL (ref 30.0–36.0)
MCV: 89.6 fL (ref 80.0–100.0)
Monocytes Absolute: 0.4 10*3/uL (ref 0.1–1.0)
Monocytes Relative: 7 %
Neutro Abs: 3.4 10*3/uL (ref 1.7–7.7)
Neutrophils Relative %: 70 %
Platelets: 200 10*3/uL (ref 150–400)
RBC: 5.18 MIL/uL — ABNORMAL HIGH (ref 3.87–5.11)
RDW: 14.9 % (ref 11.5–15.5)
WBC: 4.9 10*3/uL (ref 4.0–10.5)
nRBC: 0 % (ref 0.0–0.2)

## 2023-01-05 LAB — RESP PANEL BY RT-PCR (RSV, FLU A&B, COVID)  RVPGX2
Influenza A by PCR: NEGATIVE
Influenza B by PCR: NEGATIVE
Resp Syncytial Virus by PCR: NEGATIVE
SARS Coronavirus 2 by RT PCR: NEGATIVE

## 2023-01-05 LAB — LACTIC ACID, PLASMA
Lactic Acid, Venous: 2.1 mmol/L (ref 0.5–1.9)
Lactic Acid, Venous: 1.3 mmol/L (ref 0.5–1.9)

## 2023-01-05 MED ORDER — SODIUM CHLORIDE 0.9 % IV SOLN
100.0000 mg | INTRAVENOUS | Status: AC
  Administered 2023-01-05: 100 mg via INTRAVENOUS
  Filled 2023-01-05: qty 100

## 2023-01-05 MED ORDER — ALBUTEROL SULFATE (2.5 MG/3ML) 0.083% IN NEBU
2.5000 mg | INHALATION_SOLUTION | RESPIRATORY_TRACT | Status: DC | PRN
  Administered 2023-01-05: 2.5 mg via RESPIRATORY_TRACT
  Filled 2023-01-05: qty 3

## 2023-01-05 MED ORDER — SODIUM CHLORIDE 0.9 % IN NEBU: 3.0000 mL | INHALATION_SOLUTION | Freq: Three times a day (TID) | RESPIRATORY_TRACT | Status: DC | PRN

## 2023-01-05 MED ORDER — SODIUM CHLORIDE 0.9 % IV SOLN
2.0000 g | Freq: Once | INTRAVENOUS | Status: AC
  Administered 2023-01-05: 2 g via INTRAVENOUS
  Filled 2023-01-05: qty 12.5

## 2023-01-05 MED ORDER — DOXYCYCLINE HYCLATE 100 MG PO CAPS: 100.0000 mg | ORAL_CAPSULE | Freq: Two times a day (BID) | ORAL | 0 refills | Status: AC

## 2023-01-05 MED ORDER — AMOXICILLIN-POT CLAVULANATE 875-125 MG PO TABS: 1.0000 | ORAL_TABLET | Freq: Two times a day (BID) | ORAL | 0 refills | Status: AC

## 2023-01-05 NOTE — ED Notes (Signed)
ED Provider at bedside. 

## 2023-01-05 NOTE — ED Provider Notes (Incomplete)
[place] [facility] Provider Note   CSN: [id number] Arrival date & time: 01/05/23  1715     History {Add pertinent medical, surgical, social history, OB history to HPI:1} Chief Complaint  Patient presents with   Cough    Taylor Stein is a 77 y.o. female.  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  Patient reports that for the past 2 weeks she has had a productive cough.  Also had a fever of 101.8 last night.  Says that she was recently hospitalized for pneumonia.  On chart review was hospitalized from 10/31/2022 to 11/04/2022 and was diagnosed with community-acquired pneumonia.  Was sent home on cefepime and doxycycline which she reports that she completed approximately 3 weeks ago.  Having mild shortness of breath but denies any chest pains.  Has been doing percussive therapy and saline nebs and albuterol nebs at home without significant improvement.  Her pulmonologist is Dr Su Monks.        Home Medications Prior to Admission medications   Medication Sig Start Date End Date Taking? Authorizing Provider  ALBUTEROL IN Inhale into the lungs.    [provider]  atorvastatin (LIPITOR) 40 MG tablet Take 40 mg by mouth daily.    [provider]  metFORMIN (GLUCOPHAGE) 500 MG tablet Take by mouth 4 (four) times daily.    [provider]  traMADol (ULTRAM) 50 MG tablet Take 1 tablet (50 mg total) by mouth every 6 (six) hours as needed. 04/23/19   Vanetta Mulders, MD      Allergies    Sulfa antibiotics, Codeine, Dilaudid [hydromorphone hcl], Gentamicin, Percocet [oxycodone-acetaminophen], Vancomycin, and Vicodin [hydrocodone-acetaminophen]    Review of Systems   Review of Systems  Physical Exam Updated Vital Signs BP 134/74 (BP Location: Left Arm)   Pulse (!) 104   Temp 99 F (37.2 C) (Oral)    Resp 18   Ht 5\' 4"  (1.626 m)   Wt 72.6 kg   SpO2 94%   BMI 27.46 kg/m  Physical Exam Vitals and nursing note reviewed.  Constitutional:      General: She is not in acute distress.    Appearance: She is well-developed.  HENT:     Head: Normocephalic and atraumatic.     Right Ear: External ear normal.     Left Ear: External ear normal.     Nose: Nose normal.  Eyes:     Extraocular Movements: Extraocular movements intact.     Conjunctiva/sclera: Conjunctivae normal.     Pupils: Pupils are equal, round, and reactive to light.  Cardiovascular:     Rate and Rhythm: Normal rate and regular rhythm.     Heart sounds: No murmur heard. Pulmonary:     Effort: Pulmonary effort is normal. No respiratory distress.     Breath sounds: Wheezing and rhonchi present.  Musculoskeletal:     Cervical back: Normal range of motion and neck supple.     Right lower leg: No edema.     Left lower leg: No edema.  Skin:    General: Skin is warm and dry.  Neurological:     Mental Status: She is alert and oriented to person, place, and time. Mental status is at baseline.  Psychiatric:        Mood and Affect: Mood normal.     ED Results / Procedures / Treatments  Labs (all labs ordered are listed, but only abnormal results are displayed) Labs Reviewed  BASIC METABOLIC PANEL - Abnormal; Notable for the following components:      Result Value   CO2 19 (*)    Glucose, Bld 209 (*)    Creatinine, Ser 1.10 (*)    GFR, Estimated 52 (*)    All other components within normal limits  LACTIC ACID, PLASMA - Abnormal; Notable for the following components:   Lactic Acid, Venous 2.1 (*)    All other components within normal limits  RESP PANEL BY RT-PCR (RSV, FLU A&B, COVID)  [id number]  CBC WITH DIFFERENTIAL/PLATELET  LACTIC ACID, PLASMA  CBC WITH DIFFERENTIAL/PLATELET    EKG None  Radiology DG Chest 2 View  Result Date: 01/05/2023 CLINICAL DATA:  Persistent cough and congestion EXAM: CHEST - 2 VIEW  COMPARISON:  Chest radiograph dated 10/30/2022 FINDINGS: Right chest wall port tip projects over the superior cavoatrial junction. Persistent bibasilar patchy opacities. No focal consolidations. No pleural effusion or pneumothorax. The heart size and mediastinal contours are within normal limits. No acute osseous abnormality. Partially imaged epigastric embolization coils. IMPRESSION: Persistent bibasilar patchy opacities, which may represent atelectasis/scarring or residual infection. Electronically Signed   By: Agustin Cree M.D.   On: 01/05/2023 18:29    Procedures Procedures  {Document cardiac monitor, telemetry assessment procedure when appropriate:1}  Medications Ordered in ED Medications - No data to display  ED Course/ Medical Decision Making/ A&P   {   Click here for ABCD2, HEART and other calculatorsREFRESH Note before signing :1}                          Medical Decision Making Amount and/or Complexity of Data Reviewed Labs: ordered. Radiology: ordered.  Risk Prescription drug management.   ***  {Document critical care time when appropriate:1} {Document review of labs and clinical decision tools ie heart score, Chads2Vasc2 etc:1}  {Document your independent review of radiology images, and any outside records:1} {Document your discussion with family members, caretakers, and with consultants:1} {Document social determinants of health affecting pt's care:1} {Document your decision making why or why not admission, treatments were needed:1} Final Clinical Impression(s) / ED Diagnoses Final diagnoses:  None    Rx / DC Orders ED Discharge Orders     None

## 2023-01-05 NOTE — ED Triage Notes (Signed)
Pt admitted in March for PNA; reports continued cough and congestion; fever yesterday

## 2023-01-05 NOTE — Discharge Instructions (Signed)
You were seen for your cough in the emergency department.   At home, please continue to chest therapy, saline nebs, albuterol nebs, and take the antibiotics we have prescribed you (Augmentin and doxycycline).    Check your MyChart online for the results of any tests that had not resulted by the time you left the emergency department.   Follow-up with your primary doctor in 2-3 days regarding your visit.  Follow-up with your pulmonologist in several days as well.  Return immediately to the emergency department if you experience any of the following: Difficulty breathing, or any other concerning symptoms.    Thank you for visiting our Emergency Department. It was a pleasure taking care of you today.

## 2023-01-05 NOTE — ED Notes (Signed)
Patient transported to CT 

## 2023-01-06 LAB — RESPIRATORY PANEL BY PCR

## 2023-01-06 NOTE — ED Notes (Signed)
Pt refused to allow the IV doxycycline to complete

## 2023-01-07 LAB — CULTURE, BLOOD (ROUTINE X 2)

## 2023-01-09 LAB — CULTURE, BLOOD (ROUTINE X 2)
Culture: NO GROWTH
Culture: NO GROWTH

## 2023-01-10 LAB — CULTURE, BLOOD (ROUTINE X 2): Special Requests: ADEQUATE

## 2023-01-11 LAB — CULTURE, BLOOD (ROUTINE X 2)

## 2024-04-04 IMAGING — CT CT HEAD W/O CM
3 series · 15 of 47 positions shown, 18 images · non-contrast
Comparison: 12/07/2006 from [REDACTED]

CLINICAL DATA: Headache.  Pancreatic tumor.



[Series 2: head wo · axial · 0.45mm/px · z∈[-354,-224]mm · 9 of 32 slices shown, 12 images]
[im 3/32  brain]
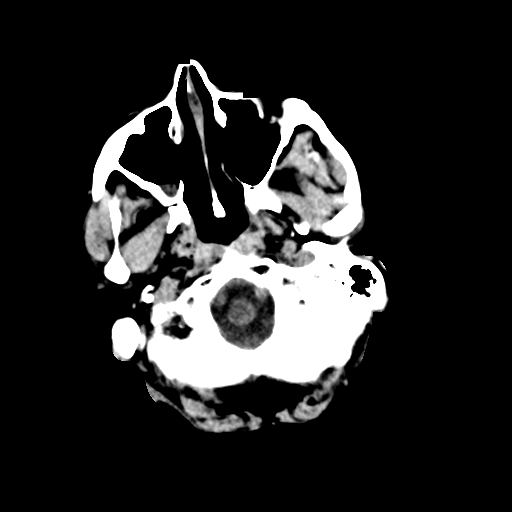
[im 3/32  bone]
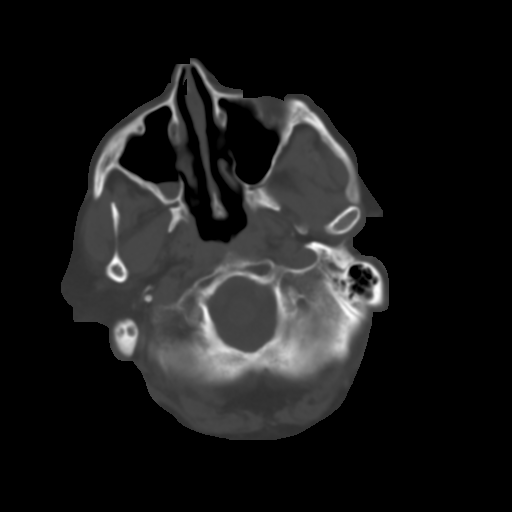
[im 6/32  brain]
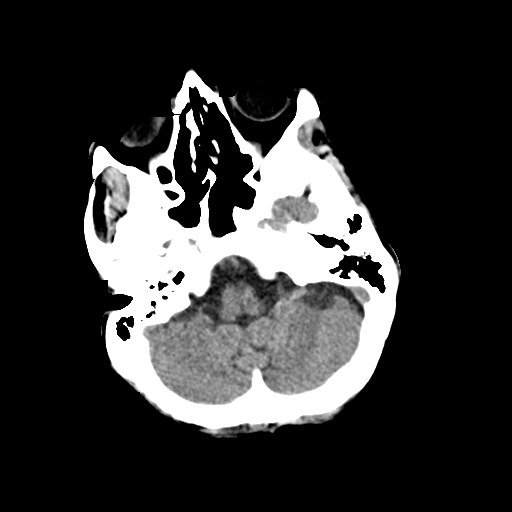
[im 9/32  brain]
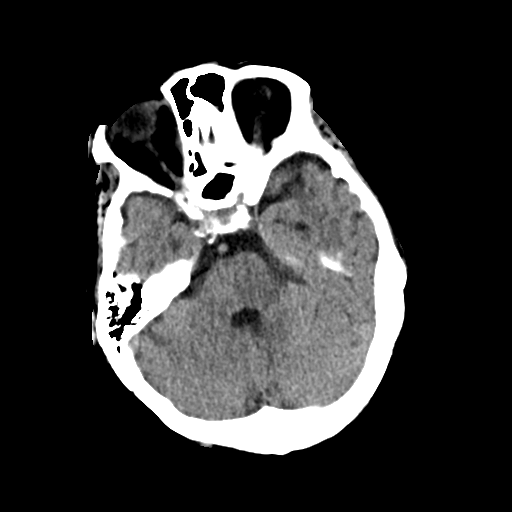
[im 12/32  brain]
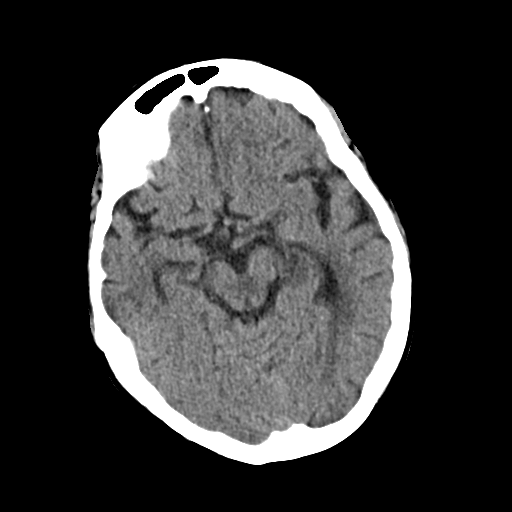
[im 17/32  brain]
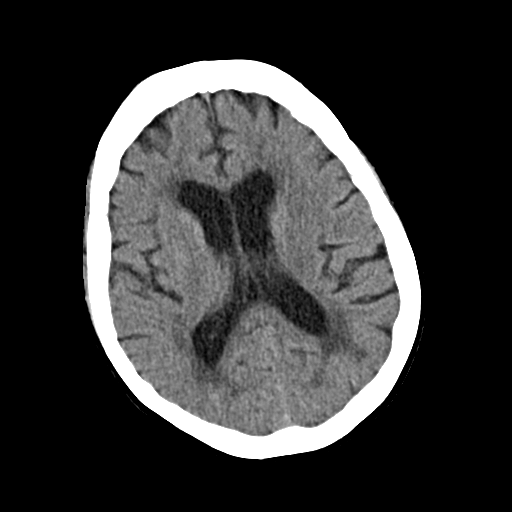
[im 17/32  bone]
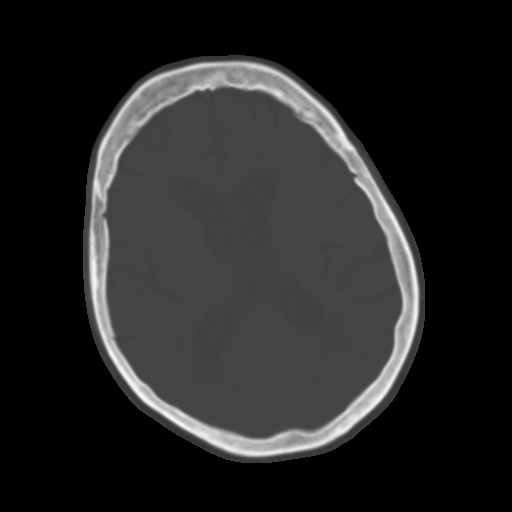
[im 20/32  brain]
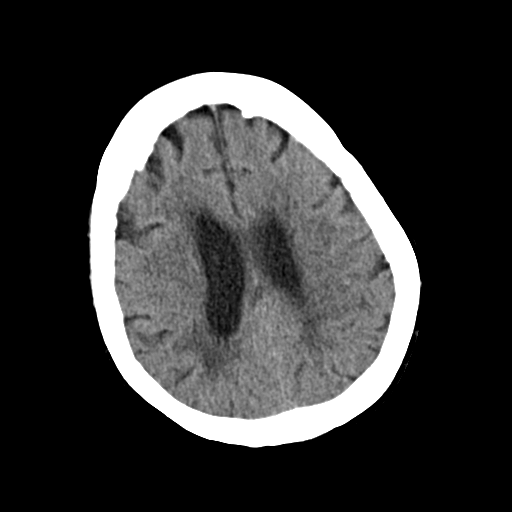
[im 23/32  brain]
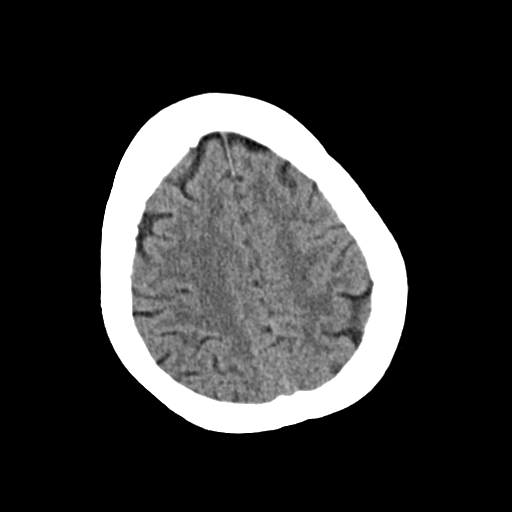
[im 26/32  brain]
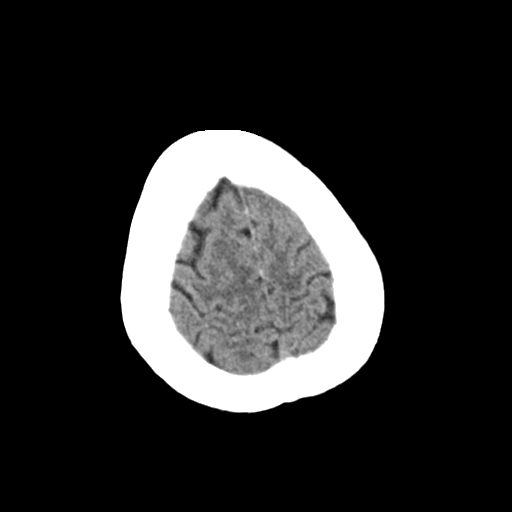
[im 29/32  brain]
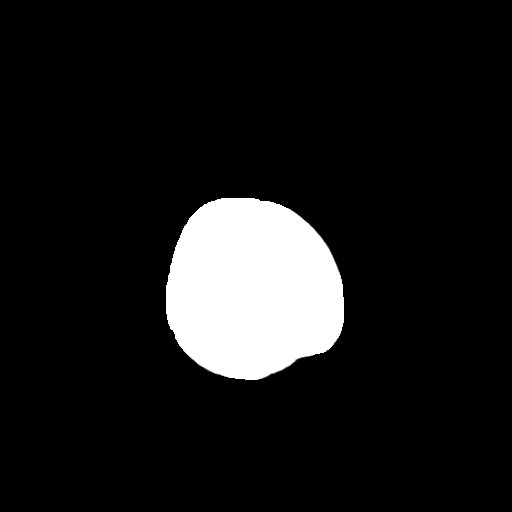
[im 29/32  bone]
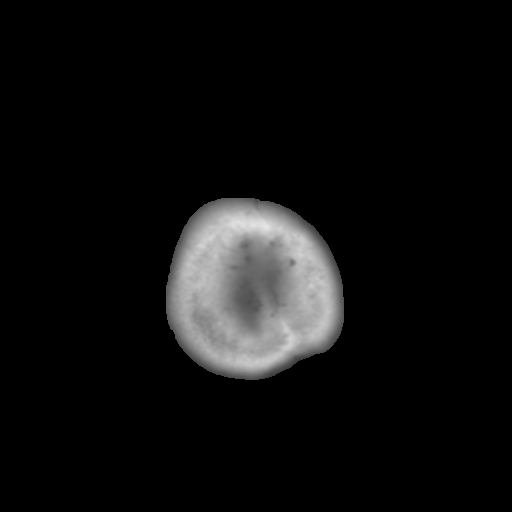

[Series 4: coronal soft · coronal · 0.31mm/px · 3 of 67 slices shown]
[im 23/67  brain]
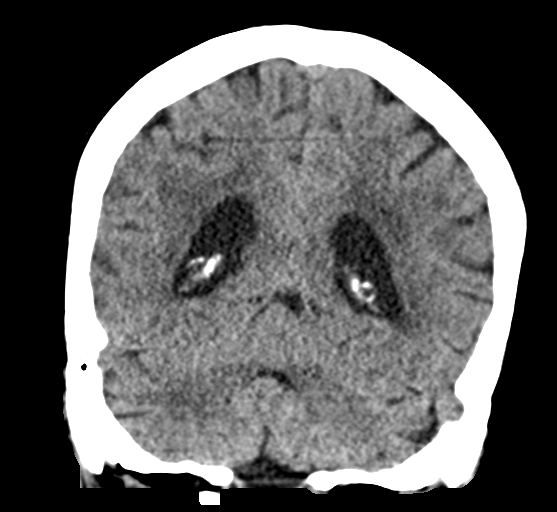
[im 30/67  brain]
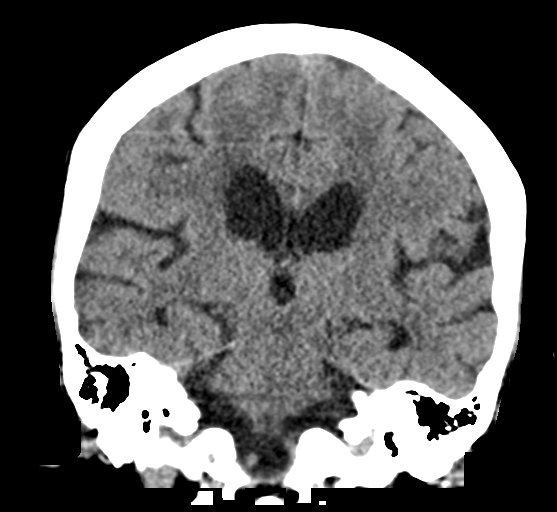
[im 37/67  brain]
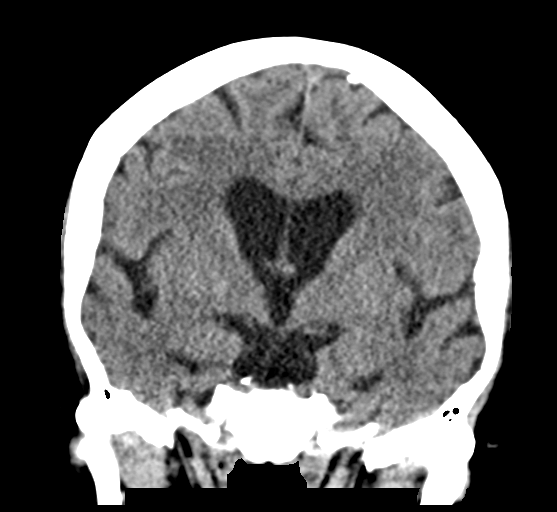

[Series 5: sag soft · sagittal · 0.30mm/px · 3 of 55 slices shown]
[im 19/55  brain]
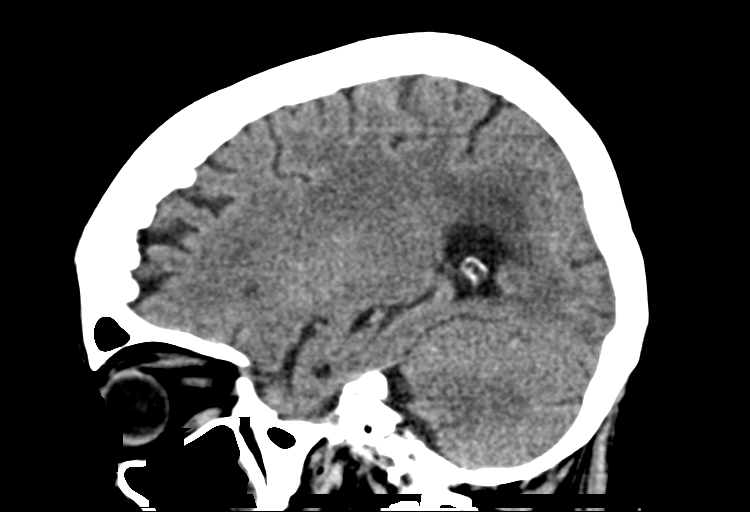
[im 28/55  brain]
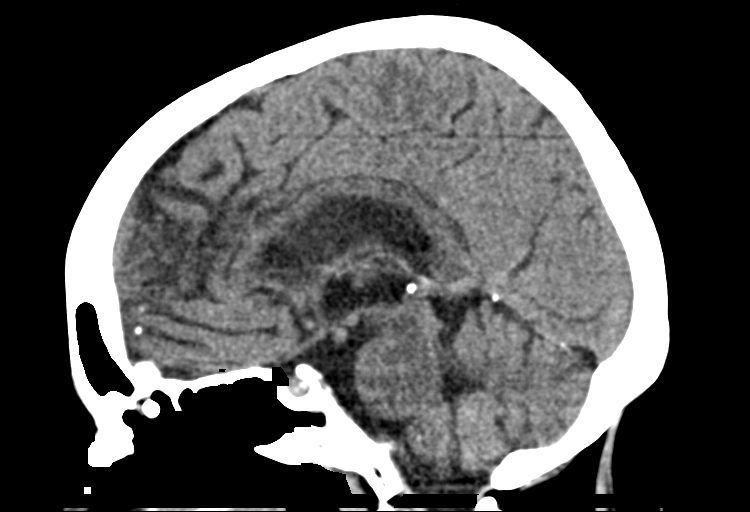
[im 37/55  brain]
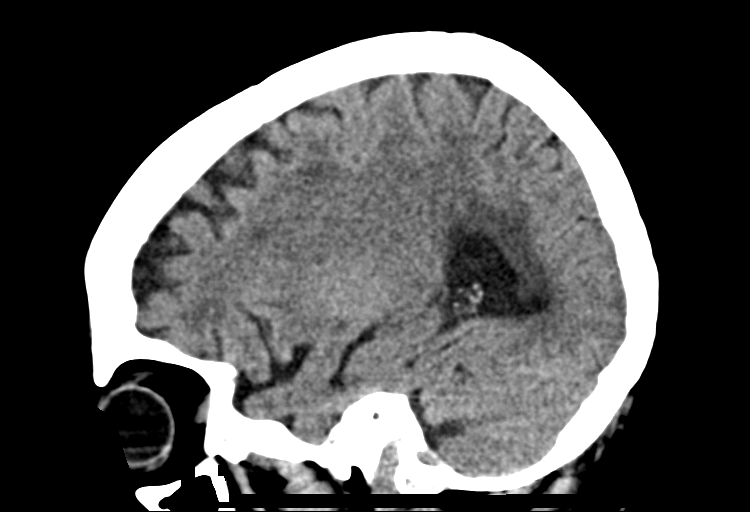

[15 of 47 positions shown; findings below may reference images not displayed]

FINDINGS: Brain: Moderate low density in the periventricular white matter
likely related to small vessel disease. No mass lesion, hemorrhage,
hydrocephalus, acute infarct, intra-axial, or extra-axial fluid
collection.

Vascular: No hyperdense vessel or unexpected calcification.

Skull: Normal

Sinuses/Orbits: Normal imaged portions of the orbits and globes.
Prior sinus surgery. Mucosal thickening right maxillary sinus,
sphenoid sinus. Clear mastoid air cells.

Other: None.
IMPRESSION: 1. No acute intracranial abnormality.
2. Moderate small vessel ischemic change.
3. Sinus disease.

## 2024-09-04 ENCOUNTER — Encounter (HOSPITAL_BASED_OUTPATIENT_CLINIC_OR_DEPARTMENT_OTHER): Payer: Self-pay | Admitting: Emergency Medicine

## 2024-09-04 ENCOUNTER — Emergency Department (HOSPITAL_BASED_OUTPATIENT_CLINIC_OR_DEPARTMENT_OTHER)
Admission: EM | Admit: 2024-09-04 | Discharge: 2024-09-04 | Disposition: A | Discharge status: Home / Self Care | Attending: Emergency Medicine | Admitting: Emergency Medicine

## 2024-09-04 ENCOUNTER — Other Ambulatory Visit: Payer: Self-pay

## 2024-09-04 ENCOUNTER — Emergency Department (HOSPITAL_BASED_OUTPATIENT_CLINIC_OR_DEPARTMENT_OTHER)

## 2024-09-04 DIAGNOSIS — S0990XA Unspecified injury of head, initial encounter: Secondary | ICD-10-CM | POA: Diagnosis not present

## 2024-09-04 DIAGNOSIS — W01198A Fall on same level from slipping, tripping and stumbling with subsequent striking against other object, initial encounter: Secondary | ICD-10-CM | POA: Insufficient documentation

## 2024-09-04 DIAGNOSIS — I6782 Cerebral ischemia: Secondary | ICD-10-CM | POA: Insufficient documentation

## 2024-09-04 DIAGNOSIS — S3992XA Unspecified injury of lower back, initial encounter: Secondary | ICD-10-CM | POA: Diagnosis present

## 2024-09-04 DIAGNOSIS — Z794 Long term (current) use of insulin: Secondary | ICD-10-CM | POA: Diagnosis not present

## 2024-09-04 DIAGNOSIS — S32010A Wedge compression fracture of first lumbar vertebra, initial encounter for closed fracture: Secondary | ICD-10-CM

## 2024-09-04 DIAGNOSIS — R739 Hyperglycemia, unspecified: Secondary | ICD-10-CM

## 2024-09-04 DIAGNOSIS — E1165 Type 2 diabetes mellitus with hyperglycemia: Secondary | ICD-10-CM | POA: Insufficient documentation

## 2024-09-04 DIAGNOSIS — K8689 Other specified diseases of pancreas: Secondary | ICD-10-CM | POA: Insufficient documentation

## 2024-09-04 DIAGNOSIS — Z85528 Personal history of other malignant neoplasm of kidney: Secondary | ICD-10-CM | POA: Diagnosis not present

## 2024-09-04 DIAGNOSIS — Z7984 Long term (current) use of oral hypoglycemic drugs: Secondary | ICD-10-CM | POA: Insufficient documentation

## 2024-09-04 DIAGNOSIS — S32019A Unspecified fracture of first lumbar vertebra, initial encounter for closed fracture: Secondary | ICD-10-CM | POA: Insufficient documentation

## 2024-09-04 HISTORY — DX: Type 2 diabetes mellitus without complications: E11.9

## 2024-09-04 LAB — CBC WITH DIFFERENTIAL/PLATELET
Abs Immature Granulocytes: 0.01 K/uL (ref 0.00–0.07)
Basophils Absolute: 0 K/uL (ref 0.0–0.1)
Basophils Relative: 0 %
Eosinophils Absolute: 0.1 K/uL (ref 0.0–0.5)
Eosinophils Relative: 2 %
HCT: 36.9 % (ref 36.0–46.0)
Hemoglobin: 12.3 g/dL (ref 12.0–15.0)
Immature Granulocytes: 0 %
Lymphocytes Relative: 13 %
Lymphs Abs: 0.7 K/uL (ref 0.7–4.0)
MCH: 29.9 pg (ref 26.0–34.0)
MCHC: 33.3 g/dL (ref 30.0–36.0)
MCV: 89.8 fL (ref 80.0–100.0)
Monocytes Absolute: 0.4 K/uL (ref 0.1–1.0)
Monocytes Relative: 8 %
Neutro Abs: 4 K/uL (ref 1.7–7.7)
Neutrophils Relative %: 77 %
Platelets: 169 K/uL (ref 150–400)
RBC: 4.11 MIL/uL (ref 3.87–5.11)
RDW: 13.1 % (ref 11.5–15.5)
WBC: 5.2 K/uL (ref 4.0–10.5)
nRBC: 0 % (ref 0.0–0.2)

## 2024-09-04 LAB — CBG MONITORING, ED
Glucose-Capillary: 353 mg/dL — ABNORMAL HIGH (ref 70–99)
Glucose-Capillary: 291 mg/dL — ABNORMAL HIGH (ref 70–99)

## 2024-09-04 LAB — COMPREHENSIVE METABOLIC PANEL WITH GFR
ALT: 32 U/L (ref 0–44)
AST: 32 U/L (ref 15–41)
Albumin: 3.8 g/dL (ref 3.5–5.0)
Alkaline Phosphatase: 65 U/L (ref 38–126)
Anion gap: 10 (ref 5–15)
BUN: 20 mg/dL (ref 8–23)
CO2: 25 mmol/L (ref 22–32)
Calcium: 9.6 mg/dL (ref 8.9–10.3)
Chloride: 100 mmol/L (ref 98–111)
Creatinine, Ser: 1.34 mg/dL — ABNORMAL HIGH (ref 0.44–1.00)
GFR, Estimated: 40 mL/min — ABNORMAL LOW
Glucose, Bld: 379 mg/dL — ABNORMAL HIGH (ref 70–99)
Potassium: 4.2 mmol/L (ref 3.5–5.1)
Sodium: 135 mmol/L (ref 135–145)
Total Bilirubin: 1 mg/dL (ref 0.0–1.2)
Total Protein: 6.7 g/dL (ref 6.5–8.1)

## 2024-09-04 LAB — LIPASE, BLOOD: Lipase: 31 U/L (ref 11–51)

## 2024-09-04 LAB — URINALYSIS, W/ REFLEX TO CULTURE (INFECTION SUSPECTED)
Bilirubin Urine: NEGATIVE
Glucose, UA: >=500 mg/dL — AB
Ketones, ur: NEGATIVE mg/dL
Nitrite: NEGATIVE
Protein, ur: 30 mg/dL — AB
Specific Gravity, Urine: 1.01 (ref 1.005–1.030)
WBC, UA: >50 WBC/hpf (ref 0–5)
pH: 5.5 (ref 5.0–8.0)

## 2024-09-04 MED ORDER — SODIUM CHLORIDE 0.9 % IV BOLUS
500.0000 mL | Freq: Once | INTRAVENOUS | Status: AC
  Administered 2024-09-04: 500 mL via INTRAVENOUS

## 2024-09-04 MED ORDER — IOHEXOL 300 MG/ML  SOLN
80.0000 mL | Freq: Once | INTRAMUSCULAR | Status: AC | PRN
  Administered 2024-09-04: 80 mL via INTRAVENOUS

## 2024-09-04 MED ORDER — LIDOCAINE 5 % EX PTCH: 1.0000 | MEDICATED_PATCH | CUTANEOUS | 0 refills | Status: AC

## 2024-09-04 MED ORDER — INSULIN ASPART 100 UNIT/ML IJ SOLN
10.0000 [IU] | Freq: Once | INTRAMUSCULAR | Status: AC
  Administered 2024-09-04: 10 [IU] via SUBCUTANEOUS
  Filled 2024-09-04: qty 10

## 2024-09-04 MED ORDER — FENTANYL CITRATE (PF) 50 MCG/ML IJ SOSY
50.0000 ug | PREFILLED_SYRINGE | Freq: Once | INTRAMUSCULAR | Status: AC
  Administered 2024-09-04: 50 ug via INTRAVENOUS
  Filled 2024-09-04: qty 1

## 2024-09-04 NOTE — ED Notes (Addendum)
 Taylor Stein

## 2024-09-04 NOTE — ED Notes (Signed)
 Pt attempted to provide urine sample; attempt unsuccessful. Pt aware UA is needed, will continue to encourage.

## 2024-09-04 NOTE — ED Provider Notes (Addendum)
 " [facility] Provider Note   CSN: [id number] Arrival date & time: 09/04/24  1350     Patient presents with: Back Pain   Taylor Stein is a 79 y.o. female.   Patient is a 79 year old female with a history of cystic fibrosis, diabetes and renal cell carcinoma who presents with back pain.  She says about 5 days ago she was trying to get into bed and fell.  She says she fell backward and hit her back on a walker.  She did hit her head on the floor.  There was no loss of consciousness.  She is not anticoagulants.  She has had pain in her low back since that time.  It radiates around to her abdomen.  She has associated abdominal pain.  No nausea or vomiting.  No fevers.  No change in urination.  She also says that her glucose has been elevated.  Her husband states that she has been taking her medication as always.  She takes an injection of 22 units of long-acting insulin  every night.  He reports that she does not use any other medication.  Apparently she does have a short acting insulin  at home but does not know how to use this.  Her glucose has been running high over the last 2 to 3 weeks.  She denies any fevers, coughing colds or other recent illnesses.       Prior to Admission medications  Medication Sig Start Date End Date Taking? Authorizing Provider  lidocaine  (LIDODERM ) 5 % Place 1 patch onto the skin daily. Remove & Discard patch within 12 hours or as directed by MD 09/04/24  Yes Lenor Hollering, MD  ALBUTEROL  IN Inhale into the lungs.    [provider]  amoxicillin -clavulanate (AUGMENTIN ) 875-125 MG tablet Take 1 tablet by mouth every 12 (twelve) hours. 01/05/23   Yolande Lamar BROCKS, MD  atorvastatin (LIPITOR) 40 MG tablet Take 40 mg by mouth daily.    [provider]  metFORMIN (GLUCOPHAGE) 500 MG tablet Take by mouth 4 (four) times daily.    [provider]  traMADol  (ULTRAM ) 50 MG tablet Take 1 tablet (50 mg  total) by mouth every 6 (six) hours as needed. 04/23/19   Zackowski, Scott, MD    Allergies: Sulfa antibiotics, Codeine, Dilaudid [hydromorphone hcl], Gentamicin, Percocet [oxycodone-acetaminophen ], Vancomycin, and Vicodin [hydrocodone-acetaminophen ]    Review of Systems  Constitutional:  Positive for fatigue. Negative for chills, diaphoresis and fever.  HENT:  Negative for congestion, rhinorrhea and sneezing.   Eyes: Negative.   Respiratory:  Negative for cough, chest tightness and shortness of breath.   Cardiovascular:  Negative for chest pain and leg swelling.  Gastrointestinal:  Positive for abdominal pain. Negative for diarrhea, nausea and vomiting.  Genitourinary:  Negative for difficulty urinating, flank pain, frequency and hematuria.  Musculoskeletal:  Positive for back pain. Negative for arthralgias.  Skin:  Positive for wound. Negative for rash.  Neurological:  Negative for dizziness, speech difficulty, weakness, numbness and headaches.    Updated Vital Signs BP (!) 136/53 (BP Location: Left Arm)   Pulse 70   Temp 98.3 F (36.8 C) (Oral)   Resp 14   Ht 5' 4 (1.626 m)   Wt 68 kg   SpO2 98%   BMI 25.75 kg/m   Physical Exam Constitutional:      Appearance: She is well-developed.  HENT:     Head: Normocephalic and atraumatic.  Eyes:     Pupils:  Pupils are equal, round, and reactive to light.  Cardiovascular:     Rate and Rhythm: Normal rate and regular rhythm.     Heart sounds: Normal heart sounds.  Pulmonary:     Effort: Pulmonary effort is normal. No respiratory distress.     Breath sounds: Normal breath sounds. No wheezing or rales.  Chest:     Chest wall: No tenderness.  Abdominal:     General: Bowel sounds are normal.     Palpations: Abdomen is soft.     Tenderness: There is abdominal tenderness (Tenderness to the lower abdomen, primarily on the right side). There is no guarding or rebound.     Comments: No visualized external trauma noted to the chest or  abdomen  Musculoskeletal:        General: Normal range of motion.     Cervical back: Normal range of motion and neck supple.     Comments: No pain of the cervical or thoracic spine.  There is tenderness to the mid and lower lumbosacral spine and to the right lumbar region in the lower back.  No visualized external trauma is noted.  No pain with palpation or range of motion of the extremities.  There is a healing abrasion to her right lower leg.  No surrounding signs of infection.  Lymphadenopathy:     Cervical: No cervical adenopathy.  Skin:    General: Skin is warm and dry.     Findings: No rash.  Neurological:     Mental Status: She is alert and oriented to person, place, and time.     (all labs ordered are listed, but only abnormal results are displayed) Labs Reviewed  COMPREHENSIVE METABOLIC PANEL WITH GFR - Abnormal; Notable for the following components:      Result Value   Glucose, Bld 379 (*)    Creatinine, Ser 1.34 (*)    GFR, Estimated 40 (*)    All other components within normal limits  URINALYSIS, W/ REFLEX TO CULTURE (INFECTION SUSPECTED) - Abnormal; Notable for the following components:   APPearance CLOUDY (*)    Glucose, UA >=500 (*)    Hgb urine dipstick TRACE (*)    Protein, ur 30 (*)    Leukocytes,Ua MODERATE (*)    Bacteria, UA MANY (*)    All other components within normal limits  CBG MONITORING, ED - Abnormal; Notable for the following components:   Glucose-Capillary 353 (*)    All other components within normal limits  CBG MONITORING, ED - Abnormal; Notable for the following components:   Glucose-Capillary 291 (*)    All other components within normal limits  URINE CULTURE  LIPASE, BLOOD  CBC WITH DIFFERENTIAL/PLATELET    EKG: None  Radiology: CT ABDOMEN PELVIS W CONTRAST Addendum Date: 09/04/2024 ADDENDUM REPORT: 09/04/2024 18:17 ADDENDUM: Please see lumbar spine for additional information. Six lumbar type vertebral bodies are noted with partial  sacralization at L6. Given segmentation, acute compression deformity is present at L1. Electronically Signed   By: Leita Birmingham M.D.   On: 09/04/2024 18:17   Result Date: 09/04/2024 CLINICAL DATA:  Right lower quadrant abdominal pain. Fall while getting out of bed with right side low back and hip pain. History of renal cell carcinoma. EXAM: CT ABDOMEN AND PELVIS WITH CONTRAST TECHNIQUE: Multidetector CT imaging of the abdomen and pelvis was performed using the standard protocol following bolus administration of intravenous contrast. RADIATION DOSE REDUCTION: This exam was performed according to the departmental dose-optimization program which includes automated  exposure control, adjustment of the mA and/or kV according to patient size and/or use of iterative reconstruction technique. CONTRAST:  80mL OMNIPAQUE  IOHEXOL  300 MG/ML  SOLN COMPARISON:  02/26/2024. FINDINGS: Lower chest: Coronary artery calcifications are noted. There is atelectasis at the lung bases. Hepatobiliary: No focal liver abnormality is seen. Status post cholecystectomy. No biliary dilatation. Pancreas: Surgical changes are noted at the pancreatic head. There is a 2.2 x 3.5 cm hypervascular lesion at the pancreatic head. No pancreatic ductal dilatation or surrounding inflammatory changes. Spleen: Normal in size without focal abnormality. Adrenals/Urinary Tract: The right kidney and adrenal gland are surgically absent. The left adrenal gland is within normal limits. The right kidney enhances normally. There is a cyst in the mid left kidney. No renal calculus or hydronephrosis is seen. The bladder is unremarkable. Stomach/Bowel: The stomach is within normal limits. No bowel obstruction, free air, or pneumatosis is seen. Appendix is not seen. Vascular/Lymphatic: Aortic atherosclerosis. No enlarged abdominal or pelvic lymph nodes. Reproductive: Status post hysterectomy. No adnexal masses. Other: No abdominopelvic ascites. Musculoskeletal:  Degenerative changes are present in the thoracolumbar spine. There is a compression deformity at T12 with loss of vertebral body height of approximately 20%. No retropulsed element is seen. Mild-to-moderate degenerative changes are noted at the hips bilaterally. IMPRESSION: 1. Acute compression deformity at the T12. 2. No evidence of solid organ injury. 3. 2.2 x 3.5 hypervascular lesion in the head of the pancreas, slightly increased in size from the prior exam. 4. Aortic atherosclerosis. Electronically Signed: By: Leita Birmingham M.D. On: 09/04/2024 17:50   CT L-SPINE NO CHARGE Result Date: 09/04/2024 CLINICAL DATA:  Fall with right-sided lower back and right hip pain. EXAM: CT LUMBAR SPINE WITHOUT CONTRAST TECHNIQUE: Multidetector CT imaging of the lumbar spine was performed without intravenous contrast administration. Multiplanar CT image reconstructions were also generated. RADIATION DOSE REDUCTION: This exam was performed according to the departmental dose-optimization program which includes automated exposure control, adjustment of the mA and/or kV according to patient size and/or use of iterative reconstruction technique. COMPARISON:  09/04/2024. FINDINGS: Segmentation: 6 lumbar type vertebrae. Rudimentary ribs are noted at the level of L1. There is partial sacralization of the L6 vertebral body on the right. Alignment: There is a mild anterolisthesis at L6. Vertebrae: There is an acute compression deformity in the superior and inferior endplates at L1 with loss of vertebral body height of a proximally 20%. No retropulsed element is seen Paraspinal and other soft tissues: Mild paravertebral soft tissue fat stranding is noted at the level of L1. No epidural or paraspinal hematoma is identified. Please see CT abdomen and pelvis for additional information. Disc levels: Multilevel facet arthropathy and disc bulges are noted, most pronounced at L4-L5 with moderate spinal canal stenosis at this level. At the level  of T12-L1. There is a diffuse disc bulge without evidence of spinal canal or neural foraminal stenosis. At L1-L2, there is a mild disc bulge without evidence of spinal canal or neural foraminal stenosis. No significant spinal canal or neural foraminal stenosis is seen at the remaining levels. IMPRESSION: 1. Acute compression fracture at L1 for given lumbar segmentation. No significant spinal canal or neural foraminal stenosis is seen at this level. 2. Multilevel degenerative changes in the lumbar spine. Electronically Signed   By: Leita Birmingham M.D.   On: 09/04/2024 18:15   CT Head Wo Contrast Result Date: 09/04/2024 CLINICAL DATA:  Head trauma, fall backwards a few days ago. EXAM: CT HEAD WITHOUT CONTRAST CT CERVICAL SPINE  WITHOUT CONTRAST TECHNIQUE: Multidetector CT imaging of the head and cervical spine was performed following the standard protocol without intravenous contrast. Multiplanar CT image reconstructions of the cervical spine were also generated. RADIATION DOSE REDUCTION: This exam was performed according to the departmental dose-optimization program which includes automated exposure control, adjustment of the mA and/or kV according to patient size and/or use of iterative reconstruction technique. COMPARISON:  01/18/2022. FINDINGS: CT HEAD FINDINGS Brain: No acute intracranial hemorrhage, midline shift, or mass effect is seen. No extra-axial fluid collection. Periventricular white matter hypodensities are present bilaterally. Mild atrophy is noted. No hydrocephalus. Vascular: No hyperdense vessel or unexpected calcification. Skull: Normal. Negative for fracture or focal lesion. Sinuses/Orbits: Small air-fluid levels are noted in the right maxillary and left sphenoid sinuses. No acute orbital abnormality. Other: None. CT CERVICAL SPINE FINDINGS Alignment: Normal. Skull base and vertebrae: No acute fracture. No primary bone lesion or focal pathologic process. Soft tissues and spinal canal: No  prevertebral fluid or swelling. No visible canal hematoma. Disc levels: Mild multilevel intervertebral disc space narrowing, degenerative endplate changes, and facet arthropathy. Upper chest: No acute abnormality. Other: Carotid artery calcifications. A right internal jugular central venous catheter is noted. IMPRESSION: 1. No acute intracranial process. 2. Atrophy with chronic microvascular ischemic changes. 3. Right maxillary and left sphenoid sinus disease. 4. Degenerative changes in the cervical spine without evidence of acute fracture. Electronically Signed   By: Leita Birmingham M.D.   On: 09/04/2024 18:00   CT Cervical Spine Wo Contrast Result Date: 09/04/2024 CLINICAL DATA:  Head trauma, fall backwards a few days ago. EXAM: CT HEAD WITHOUT CONTRAST CT CERVICAL SPINE WITHOUT CONTRAST TECHNIQUE: Multidetector CT imaging of the head and cervical spine was performed following the standard protocol without intravenous contrast. Multiplanar CT image reconstructions of the cervical spine were also generated. RADIATION DOSE REDUCTION: This exam was performed according to the departmental dose-optimization program which includes automated exposure control, adjustment of the mA and/or kV according to patient size and/or use of iterative reconstruction technique. COMPARISON:  01/18/2022. FINDINGS: CT HEAD FINDINGS Brain: No acute intracranial hemorrhage, midline shift, or mass effect is seen. No extra-axial fluid collection. Periventricular white matter hypodensities are present bilaterally. Mild atrophy is noted. No hydrocephalus. Vascular: No hyperdense vessel or unexpected calcification. Skull: Normal. Negative for fracture or focal lesion. Sinuses/Orbits: Small air-fluid levels are noted in the right maxillary and left sphenoid sinuses. No acute orbital abnormality. Other: None. CT CERVICAL SPINE FINDINGS Alignment: Normal. Skull base and vertebrae: No acute fracture. No primary bone lesion or focal pathologic  process. Soft tissues and spinal canal: No prevertebral fluid or swelling. No visible canal hematoma. Disc levels: Mild multilevel intervertebral disc space narrowing, degenerative endplate changes, and facet arthropathy. Upper chest: No acute abnormality. Other: Carotid artery calcifications. A right internal jugular central venous catheter is noted. IMPRESSION: 1. No acute intracranial process. 2. Atrophy with chronic microvascular ischemic changes. 3. Right maxillary and left sphenoid sinus disease. 4. Degenerative changes in the cervical spine without evidence of acute fracture. Electronically Signed   By: Leita Birmingham M.D.   On: 09/04/2024 18:00   DG Lumbar Spine Complete Result Date: 09/04/2024 EXAM: 4 VIEW(S) XRAY OF THE LUMBAR SPINE 09/04/2024 02:57:00 PM COMPARISON: CT abdomen 02/26/2024. CLINICAL HISTORY: fall FINDINGS: LUMBAR SPINE: BONES: Grade 1 anterolisthesis of L5-S1. Interval development of age indeterminate L1 vertebral body height loss. DISCS AND DEGENERATIVE CHANGES: Multilevel mild-to-moderate degenerative changes of the spine. Facet arthropathy. SOFT TISSUES: Surgical clips overlie the abdomen. Vascular  coiling overlie the abdomen. IMPRESSION: 1. Interval development of age indeterminate L1 vertebral body height loss. Correlate with point tenderness to palpation to evaluate for acuity. Electronically signed by: Morgane Naveau MD 09/04/2024 03:19 PM EST RP Workstation: [id number]   DG Hip Unilat W or Wo Pelvis 2-3 Views Right Result Date: 09/04/2024 EXAM: 2 or 3 VIEW(S) XRAY OF THE UNILATERAL HIP 09/04/2024 02:57:00 PM COMPARISON: None available. CLINICAL HISTORY: fall fall fall fall fall FINDINGS: BONES AND JOINTS: Bilateral moderate to severe hip degenerative changes with joint space narrowing, osteophytes and subchondral sclerosis. SOFT TISSUES: Vascular calcifications. IMPRESSION: 1. No evidence of acute traumatic injury. Electronically signed by: Morgane Naveau MD 09/04/2024 03:17 PM  EST RP Workstation: [id number]     Procedures   Medications Ordered in the ED  fentaNYL  (SUBLIMAZE ) injection 50 mcg (50 mcg Intravenous Given 09/04/24 1608)  sodium chloride  0.9 % bolus 500 mL (0 mLs Intravenous Stopped 09/04/24 1728)  sodium chloride  0.9 % bolus 500 mL (0 mLs Intravenous Stopped 09/04/24 1830)  insulin  aspart (novoLOG ) injection 10 Units (10 Units Subcutaneous Given 09/04/24 1652)  iohexol  (OMNIPAQUE ) 300 MG/ML solution 80 mL (80 mLs Intravenous Contrast Given 09/04/24 1705)                                    Medical Decision Making Amount and/or Complexity of Data Reviewed Labs: ordered. Radiology: ordered.  Risk Prescription drug management.   This patient presents to the ED for concern of back pain, this involves an extensive number of treatment options, and is a complaint that carries with it a high risk of complications and morbidity.  I considered the following differential and admission for this acute, potentially life threatening condition.  The differential diagnosis includes fracture, muscle strain, rib fracture, other intra-abdominal trauma  MDM:    Patient is a 79 year old who presents with back pain after recent fall.  She did hit her head.  She denies any other injuries but she did have some associated abdominal pain.  Her imaging studies show a compression fracture of L1 which is likely etiology for her pain.  I did offer to get an LSO brace.  However she says she is ready to go home and does not want to wait for us  to get this.  She does request prescription for pain medication.  However she is allergic to hydrocodone and oxycodone.  She says that she also is allergic to tramadol .  She does not want to try Mobic.  I offered her Lidoderm  patches which she is amenable to using.  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  Her labs are nonconcerning.   Her urine was equivocal for infection.  There was a lot of squamous cells.  She is not having urinary symptoms.  She did have some lower abdominal pain which I suspect is radiating from her spinal fracture but we will send her urine for culture.  Will not start antibiotics at this point.  Other labs are nonconcerning.  Her glucose is elevated.  She was given a dose of subcu insulin  and some IV fluids.  It has improved.  She does not have any suggestions of DKA.  Encouraged her to have close follow-up with her PCP regarding this.  Likely will need to change her insulin  regimen.  She was discharged home in  good condition.  Return precautions were given.  (Labs, imaging, consults)  Labs: I Ordered, and personally interpreted labs.  The pertinent results include: Elevated glucose, mildly elevated creatinine but similar to prior values, urine equivocal for infection  Imaging Studies ordered: I ordered imaging studies including CT abdomen pelvis, CT head, CT cervical spine, right hip x-ray I independently visualized and interpreted imaging. I agree with the radiologist interpretation  Additional history obtained from husband at bedside.  External records from outside source obtained and reviewed including prior notes  Cardiac Monitoring: The patient was not maintained on a cardiac monitor.  If on the cardiac monitor, I personally viewed and interpreted the cardiac monitored which showed an underlying rhythm of:    Reevaluation: After the interventions noted above, I reevaluated the patient and found that they have :improved  Social Determinants of Health:    Disposition: Discharged to home  Co morbidities that complicate the patient evaluation  Past Medical History:  Diagnosis Date   Cancer (HCC)    Cystic fibrosis (HCC)    Diabetes mellitus without complication (HCC)    Renal cell carcinoma (HCC)    Renal cell carcinoma (HCC)      Medicines Meds ordered this encounter  Medications    fentaNYL  (SUBLIMAZE ) injection 50 mcg   sodium chloride  0.9 % bolus 500 mL   sodium chloride  0.9 % bolus 500 mL   insulin  aspart (novoLOG ) injection 10 Units   iohexol  (OMNIPAQUE ) 300 MG/ML solution 80 mL   lidocaine  (LIDODERM ) 5 %    Sig: Place 1 patch onto the skin daily. Remove & Discard patch within 12 hours or as directed by MD    Dispense:  30 patch    Refill:  0    I have reviewed the patients home medicines and have made adjustments as needed  Problem List / ED Course: Problem List Items Addressed This Visit   None Visit Diagnoses       Closed compression fracture of body of L1 vertebra (HCC)    -  Primary     Pancreatic mass         Hyperglycemia                    Final diagnoses:  Closed compression fracture of body of L1 vertebra (HCC)  Pancreatic mass  Hyperglycemia    ED Discharge Orders          Ordered    lidocaine  (LIDODERM ) 5 %  Every 24 hours        09/04/24 1908               Lenor Hollering, MD 09/04/24 1910    Lenor Hollering, MD 09/04/24 1910  "

## 2024-09-04 NOTE — ED Triage Notes (Signed)
 Pt fell backward a few days ago while getting into bed; c/o RT side lower back and RT hip pain; also reports glucose elevated since last night

## 2024-09-04 NOTE — Discharge Instructions (Addendum)
" °  Keep a close check on your blood sugar.  I suspect she will need to get on a different insulin  regimen.  Call your doctor on Tuesday regarding this.  Your urine was equivocal for infection.  We will not start you on antibiotics at this point but I have sent it for culture.  If the culture grows out bacteria, you will be contacted about this. "

## 2024-09-07 LAB — URINE CULTURE: Culture: >=100000 — AB

## 2024-09-08 ENCOUNTER — Telehealth (HOSPITAL_BASED_OUTPATIENT_CLINIC_OR_DEPARTMENT_OTHER): Payer: Self-pay

## 2024-09-08 NOTE — Telephone Encounter (Signed)
 Post ED Visit - Positive Culture Follow-up  Culture report reviewed by antimicrobial stewardship pharmacist: [facility] [x]  Elma Fail, Pharm.D. []  Venetia Gully, Pharm.D., BCPS AQ-ID []  Garrel Crews, Pharm.D., BCPS []  Almarie Lunger, [address].D., BCPS []  [place], [place].D., BCPS, AAHIVP []  Rosaline Bihari, Pharm.D., BCPS, AAHIVP []  Vernell Meier, PharmD, BCPS []  Latanya Hint, PharmD, BCPS []  Donald Medley, PharmD, BCPS []  Rocky Bold, PharmD []  Dorothyann Alert, PharmD, BCPS []  Morene Babe, PharmD  [facility] []  Rosaline Edison, PharmD []  Romona Bliss, PharmD []  Dolphus Roller, PharmD []  Veva Seip, Rph []  Vernell Daunt) Leonce, PharmD []  Eva Allis, PharmD []  Rosaline Millet, PharmD []  Iantha Batch, PharmD []  Arvin Gauss, PharmD []  Wanda Hasting, PharmD []  Ronal Rav, PharmD []  Rocky Slade, PharmD []  Bard Jeans, PharmD   Positive urine culture Reviewed by ED provider: Rocky Hamilton, PA-C  Pt denies dysuria and urinary S/S, no fever or leukocytosis, most likely ASB. No treatment needed and no further patient follow-up is required at this time.  Ruth Camelia Elbe 09/08/2024, 11:10 AM
